# Patient Record
Sex: Female | Born: 1935 | Race: White | Hispanic: No | Marital: Married | Smoking: Never smoker
Health system: Southern US, Community
[De-identification: ages and names within clinical notes are randomized; demographics above are authoritative.]

## PROBLEM LIST (undated history)

## (undated) DIAGNOSIS — K219 Gastro-esophageal reflux disease without esophagitis: Secondary | ICD-10-CM

## (undated) DIAGNOSIS — I10 Essential (primary) hypertension: Secondary | ICD-10-CM

## (undated) DIAGNOSIS — Z9289 Personal history of other medical treatment: Secondary | ICD-10-CM

## (undated) DIAGNOSIS — E785 Hyperlipidemia, unspecified: Secondary | ICD-10-CM

## (undated) DIAGNOSIS — E119 Type 2 diabetes mellitus without complications: Secondary | ICD-10-CM

## (undated) HISTORY — PX: CHOLECYSTECTOMY: SHX55

## (undated) HISTORY — PX: EXCISION MORTON'S NEUROMA: SHX5013

## (undated) HISTORY — PX: TUBAL LIGATION: SHX77

## (undated) HISTORY — PX: ABDOMINAL HYSTERECTOMY: SHX81

---

## 2014-03-30 ENCOUNTER — Encounter (HOSPITAL_COMMUNITY): Payer: Self-pay | Admitting: Emergency Medicine

## 2014-03-30 ENCOUNTER — Emergency Department (HOSPITAL_COMMUNITY): Payer: Medicare HMO

## 2014-03-30 ENCOUNTER — Observation Stay (HOSPITAL_COMMUNITY): Payer: Medicare HMO

## 2014-03-30 ENCOUNTER — Observation Stay (HOSPITAL_COMMUNITY)
Admission: EM | Admit: 2014-03-30 | Discharge: 2014-03-31 | Disposition: A | Discharge status: Home / Self Care | Payer: Medicare HMO | Attending: Interventional Cardiology | Admitting: Interventional Cardiology

## 2014-03-30 DIAGNOSIS — E785 Hyperlipidemia, unspecified: Secondary | ICD-10-CM | POA: Diagnosis present

## 2014-03-30 DIAGNOSIS — R079 Chest pain, unspecified: Secondary | ICD-10-CM | POA: Insufficient documentation

## 2014-03-30 DIAGNOSIS — Z9289 Personal history of other medical treatment: Secondary | ICD-10-CM

## 2014-03-30 DIAGNOSIS — M546 Pain in thoracic spine: Principal | ICD-10-CM

## 2014-03-30 DIAGNOSIS — E1149 Type 2 diabetes mellitus with other diabetic neurological complication: Secondary | ICD-10-CM

## 2014-03-30 DIAGNOSIS — E1142 Type 2 diabetes mellitus with diabetic polyneuropathy: Secondary | ICD-10-CM

## 2014-03-30 DIAGNOSIS — I10 Essential (primary) hypertension: Secondary | ICD-10-CM | POA: Diagnosis present

## 2014-03-30 DIAGNOSIS — R0789 Other chest pain: Secondary | ICD-10-CM

## 2014-03-30 DIAGNOSIS — IMO0002 Reserved for concepts with insufficient information to code with codable children: Secondary | ICD-10-CM | POA: Insufficient documentation

## 2014-03-30 DIAGNOSIS — K219 Gastro-esophageal reflux disease without esophagitis: Secondary | ICD-10-CM

## 2014-03-30 DIAGNOSIS — Z885 Allergy status to narcotic agent status: Secondary | ICD-10-CM | POA: Diagnosis not present

## 2014-03-30 DIAGNOSIS — M549 Dorsalgia, unspecified: Secondary | ICD-10-CM | POA: Diagnosis present

## 2014-03-30 DIAGNOSIS — E118 Type 2 diabetes mellitus with unspecified complications: Secondary | ICD-10-CM

## 2014-03-30 DIAGNOSIS — E119 Type 2 diabetes mellitus without complications: Secondary | ICD-10-CM | POA: Diagnosis present

## 2014-03-30 DIAGNOSIS — E1165 Type 2 diabetes mellitus with hyperglycemia: Secondary | ICD-10-CM | POA: Insufficient documentation

## 2014-03-30 HISTORY — DX: Gastro-esophageal reflux disease without esophagitis: K21.9

## 2014-03-30 HISTORY — DX: Type 2 diabetes mellitus without complications: E11.9

## 2014-03-30 HISTORY — DX: Essential (primary) hypertension: I10

## 2014-03-30 HISTORY — DX: Personal history of other medical treatment: Z92.89

## 2014-03-30 HISTORY — DX: Hyperlipidemia, unspecified: E78.5

## 2014-03-30 LAB — CBC WITH DIFFERENTIAL/PLATELET
Basophils Absolute: 0 10*3/uL (ref 0.0–0.1)
Basophils Relative: 0 % (ref 0–1)
EOS ABS: 0.1 10*3/uL (ref 0.0–0.7)
EOS PCT: 1 % (ref 0–5)
HCT: 41.7 % (ref 36.0–46.0)
HEMOGLOBIN: 14.7 g/dL (ref 12.0–15.0)
Lymphocytes Relative: 32 % (ref 12–46)
Lymphs Abs: 2.9 10*3/uL (ref 0.7–4.0)
MCH: 31.6 pg (ref 26.0–34.0)
MCHC: 35.3 g/dL (ref 30.0–36.0)
MCV: 89.7 fL (ref 78.0–100.0)
MONOS PCT: 7 % (ref 3–12)
Monocytes Absolute: 0.7 10*3/uL (ref 0.1–1.0)
Neutro Abs: 5.5 10*3/uL (ref 1.7–7.7)
Neutrophils Relative %: 60 % (ref 43–77)
Platelets: 219 10*3/uL (ref 150–400)
RBC: 4.65 MIL/uL (ref 3.87–5.11)
RDW: 13 % (ref 11.5–15.5)
WBC: 9.1 10*3/uL (ref 4.0–10.5)

## 2014-03-30 LAB — COMPREHENSIVE METABOLIC PANEL
ALK PHOS: 80 U/L (ref 39–117)
ALT: 24 U/L (ref 0–35)
AST: 22 U/L (ref 0–37)
Albumin: 4.1 g/dL (ref 3.5–5.2)
Anion gap: 17 — ABNORMAL HIGH (ref 5–15)
BUN: 9 mg/dL (ref 6–23)
CALCIUM: 9.5 mg/dL (ref 8.4–10.5)
CO2: 21 mEq/L (ref 19–32)
Chloride: 96 mEq/L (ref 96–112)
Creatinine, Ser: 0.49 mg/dL — ABNORMAL LOW (ref 0.50–1.10)
GLUCOSE: 325 mg/dL — AB (ref 70–99)
Potassium: 3.7 mEq/L (ref 3.7–5.3)
Sodium: 134 mEq/L — ABNORMAL LOW (ref 137–147)
Total Bilirubin: 0.7 mg/dL (ref 0.3–1.2)
Total Protein: 7.9 g/dL (ref 6.0–8.3)

## 2014-03-30 LAB — URINALYSIS, ROUTINE W REFLEX MICROSCOPIC
BILIRUBIN URINE: NEGATIVE
Hgb urine dipstick: NEGATIVE
KETONES UR: 15 mg/dL — AB
Leukocytes, UA: NEGATIVE
Nitrite: NEGATIVE
PH: 5 (ref 5.0–8.0)
Protein, ur: NEGATIVE mg/dL
Specific Gravity, Urine: 1.038 — ABNORMAL HIGH (ref 1.005–1.030)
Urobilinogen, UA: 0.2 mg/dL (ref 0.0–1.0)

## 2014-03-30 LAB — HEMOGLOBIN A1C
Hgb A1c MFr Bld: 11 % — ABNORMAL HIGH (ref <5.7)
Mean Plasma Glucose: 269 mg/dL — ABNORMAL HIGH (ref <117)

## 2014-03-30 LAB — URINE MICROSCOPIC-ADD ON

## 2014-03-30 LAB — LIPASE, BLOOD: Lipase: 33 U/L (ref 11–59)

## 2014-03-30 LAB — TROPONIN I: Troponin I: <0.3 ng/mL (ref <0.30)

## 2014-03-30 LAB — I-STAT TROPONIN, ED: Troponin i, poc: 0 ng/mL (ref 0.00–0.08)

## 2014-03-30 LAB — GLUCOSE, CAPILLARY: Glucose-Capillary: 256 mg/dL — ABNORMAL HIGH (ref 70–99)

## 2014-03-30 LAB — TSH: TSH: 25.01 u[IU]/mL — ABNORMAL HIGH (ref 0.350–4.500)

## 2014-03-30 MED ORDER — NITROGLYCERIN 0.4 MG SL SUBL: 0.4000 mg | SUBLINGUAL_TABLET | SUBLINGUAL | Status: DC | PRN

## 2014-03-30 MED ORDER — ONDANSETRON HCL 4 MG/2ML IJ SOLN
4.0000 mg | Freq: Once | INTRAMUSCULAR | Status: AC
Start: 2014-03-30 — End: 2014-03-30
  Administered 2014-03-30: 4 mg via INTRAVENOUS
  Filled 2014-03-30: qty 2

## 2014-03-30 MED ORDER — ATORVASTATIN CALCIUM 20 MG PO TABS
20.0000 mg | ORAL_TABLET | Freq: Every day | ORAL | Status: DC
  Filled 2014-03-30: qty 1

## 2014-03-30 MED ORDER — IOHEXOL 350 MG/ML SOLN
80.0000 mL | Freq: Once | INTRAVENOUS | Status: AC | PRN
  Administered 2014-03-30: 100 mL via INTRAVENOUS

## 2014-03-30 MED ORDER — METOPROLOL TARTRATE 12.5 MG HALF TABLET
12.5000 mg | ORAL_TABLET | Freq: Two times a day (BID) | ORAL | Status: DC
  Administered 2014-03-30: 12.5 mg via ORAL
  Filled 2014-03-30 (×3): qty 1

## 2014-03-30 MED ORDER — ACETAMINOPHEN 325 MG PO TABS: 650.0000 mg | ORAL_TABLET | ORAL | Status: DC | PRN

## 2014-03-30 MED ORDER — ONDANSETRON HCL 4 MG/2ML IJ SOLN: 4.0000 mg | Freq: Four times a day (QID) | INTRAMUSCULAR | Status: DC | PRN

## 2014-03-30 MED ORDER — GI COCKTAIL ~~LOC~~: 30.0000 mL | Freq: Three times a day (TID) | ORAL | Status: DC | PRN

## 2014-03-30 MED ORDER — PANTOPRAZOLE SODIUM 40 MG PO TBEC
40.0000 mg | DELAYED_RELEASE_TABLET | Freq: Every day | ORAL | Status: DC
  Administered 2014-03-30: 40 mg via ORAL
  Filled 2014-03-30: qty 1

## 2014-03-30 MED ORDER — ASPIRIN 81 MG PO CHEW
324.0000 mg | CHEWABLE_TABLET | Freq: Once | ORAL | Status: AC
  Administered 2014-03-30: 324 mg via ORAL
  Filled 2014-03-30: qty 4

## 2014-03-30 MED ORDER — ASPIRIN EC 81 MG PO TBEC
81.0000 mg | DELAYED_RELEASE_TABLET | Freq: Every day | ORAL | Status: DC
  Administered 2014-03-31: 81 mg via ORAL
  Filled 2014-03-30: qty 1

## 2014-03-30 MED ORDER — INSULIN ASPART 100 UNIT/ML ~~LOC~~ SOLN
0.0000 [IU] | Freq: Three times a day (TID) | SUBCUTANEOUS | Status: DC
  Administered 2014-03-31 (×2): 8 [IU] via SUBCUTANEOUS
  Administered 2014-03-31: 5 [IU] via SUBCUTANEOUS

## 2014-03-30 MED ORDER — MORPHINE SULFATE 2 MG/ML IJ SOLN
2.0000 mg | Freq: Once | INTRAMUSCULAR | Status: AC
  Administered 2014-03-30: 2 mg via INTRAVENOUS
  Filled 2014-03-30: qty 1

## 2014-03-30 MED ORDER — SODIUM CHLORIDE 0.9 % IV BOLUS (SEPSIS)
1000.0000 mL | Freq: Once | INTRAVENOUS | Status: AC
  Administered 2014-03-30: 1000 mL via INTRAVENOUS

## 2014-03-30 NOTE — ED Notes (Signed)
Attempted report X1

## 2014-03-30 NOTE — ED Notes (Signed)
Josh, PA at bedside. °

## 2014-03-30 NOTE — ED Notes (Signed)
Patient transported to X-ray 

## 2014-03-30 NOTE — ED Notes (Signed)
Cardiology at bedside.

## 2014-03-30 NOTE — H&P (Signed)
Cardiologist:  New PCP: Donnajean Lopes, MD  [name] is an 80 y.o. female.   Chief Complaint: Chest pain HPI:   The patient is a 80 yo female with a history of vertigo for which she takes dramamine and was just given a script for meclizine(surgical history below).  Until recently she has not seen a doctor for a very long time.  She reports developing severe, 10/10 back pain yesterday between her shoulder blades and lesser pain in the right and left sides of her chest.   The CP appears relieved with belching but returns.  She gets up about two times per night to urinate and gets numbness and tingling in her feet.  The patient currently denies nausea, vomiting, fever, diaphoresis, orthopnea, PND, cough, congestion, abdominal pain, hematochezia, melena, lower extremity edema, claudication.  She is currently pain free but was given morphine.     History reviewed. No pertinent past medical history.  Past Surgical History  Procedure Laterality Date  . Tubal ligation    . Cholecystectomy    . Abdominal hysterectomy    . Excision morton's neuroma      Family History  Problem Relation Age of Onset  . Rheumatic fever Mother 50   Social History:  reports that she has never smoked. She does not have any smokeless tobacco history on file. She reports that she drinks alcohol. Her drug history is not on file.  Allergies:  Allergies  Allergen Reactions  . Codeine Nausea And Vomiting and Other (See Comments)    "body shaking"     (Not in a hospital admission)  Results for orders placed during the hospital encounter of 03/30/14 (from the past 48 hour(s))  COMPREHENSIVE METABOLIC PANEL     Status: Abnormal   Collection Time    03/30/14  3:23 PM      Result Value Ref Range   Sodium 134 (*) 137 - 147 mEq/L   Potassium 3.7  3.7 - 5.3 mEq/L   Chloride 96  96 - 112 mEq/L   CO2 21  19 - 32 mEq/L   Glucose, Bld 325 (*) 70 - 99 mg/dL   BUN 9  6 - 23 mg/dL   Creatinine, Ser 0.49 (*) 0.50  - 1.10 mg/dL   Calcium 9.5  8.4 - 10.5 mg/dL   Total Protein 7.9  6.0 - 8.3 g/dL   Albumin 4.1  3.5 - 5.2 g/dL   AST 22  0 - 37 U/L   ALT 24  0 - 35 U/L   Alkaline Phosphatase 80  39 - 117 U/L   Total Bilirubin 0.7  0.3 - 1.2 mg/dL   GFR calc non Af Amer >90  >90 mL/min   GFR calc Af Amer >90  >90 mL/min   Comment: (NOTE)     The eGFR has been calculated using the CKD EPI equation.     This calculation has not been validated in all clinical situations.     eGFR's persistently <90 mL/min signify possible Chronic Kidney     Disease.   Anion gap 17 (*) 5 - 15  CBC WITH DIFFERENTIAL     Status: None   Collection Time    03/30/14  3:23 PM      Result Value Ref Range   WBC 9.1  4.0 - 10.5 K/uL   RBC 4.65  3.87 - 5.11 MIL/uL   Hemoglobin 14.7  12.0 - 15.0 g/dL   HCT 41.7  36.0 - 46.0 %  MCV 89.7  78.0 - 100.0 fL   MCH 31.6  26.0 - 34.0 pg   MCHC 35.3  30.0 - 36.0 g/dL   RDW 13.0  11.5 - 15.5 %   Platelets 219  150 - 400 K/uL   Neutrophils Relative % 60  43 - 77 %   Neutro Abs 5.5  1.7 - 7.7 K/uL   Lymphocytes Relative 32  12 - 46 %   Lymphs Abs 2.9  0.7 - 4.0 K/uL   Monocytes Relative 7  3 - 12 %   Monocytes Absolute 0.7  0.1 - 1.0 K/uL   Eosinophils Relative 1  0 - 5 %   Eosinophils Absolute 0.1  0.0 - 0.7 K/uL   Basophils Relative 0  0 - 1 %   Basophils Absolute 0.0  0.0 - 0.1 K/uL  LIPASE, BLOOD     Status: None   Collection Time    03/30/14  3:23 PM      Result Value Ref Range   Lipase 33  11 - 59 U/L  URINALYSIS, ROUTINE W REFLEX MICROSCOPIC     Status: Abnormal   Collection Time    03/30/14  3:28 PM      Result Value Ref Range   Color, Urine YELLOW  YELLOW   APPearance CLOUDY (*) CLEAR   Specific Gravity, Urine 1.038 (*) 1.005 - 1.030   pH 5.0  5.0 - 8.0   Glucose, UA >1000 (*) NEGATIVE mg/dL   Hgb urine dipstick NEGATIVE  NEGATIVE   Bilirubin Urine NEGATIVE  NEGATIVE   Ketones, ur 15 (*) NEGATIVE mg/dL   Protein, ur NEGATIVE  NEGATIVE mg/dL   Urobilinogen, UA  0.2  0.0 - 1.0 mg/dL   Nitrite NEGATIVE  NEGATIVE   Leukocytes, UA NEGATIVE  NEGATIVE  URINE MICROSCOPIC-ADD ON     Status: Abnormal   Collection Time    03/30/14  3:28 PM      Result Value Ref Range   Squamous Epithelial / LPF MANY (*) RARE   WBC, UA 11-20  <3 WBC/hpf   Bacteria, UA MANY (*) RARE  I-STAT TROPOININ, ED     Status: None   Collection Time    03/30/14  3:30 PM      Result Value Ref Range   Troponin i, poc 0.00  0.00 - 0.08 ng/mL   Comment 3            Comment: Due to the release kinetics of cTnI,     a negative result within the first hours     of the onset of symptoms does not rule out     myocardial infarction with certainty.     If myocardial infarction is still suspected,     repeat the test at appropriate intervals.   Dg Chest 2 View  03/30/2014   CLINICAL DATA:  LEFT side chest and back pain which began at 12:30 a.m.  EXAM: CHEST  2 VIEW  COMPARISON:  None  FINDINGS: Upper normal heart size.  Calcified tortuous aorta.  Mediastinal contours and pulmonary vascularity normal.  Bronchitic and emphysematous changes consistent with COPD.  No acute infiltrate, pleural effusion or pneumothorax.  Bones diffusely demineralized.  Surgical clips RIGHT upper quadrant question cholecystectomy.  IMPRESSION: COPD changes.  No acute abnormalities.   Electronically Signed   By: Lavonia Dana M.D.   On: 03/30/2014 16:15    Review of Systems  Constitutional: Negative for fever and diaphoresis.  HENT: Negative for congestion and  sore throat.   Respiratory: Negative for cough and shortness of breath.   Cardiovascular: Positive for chest pain. Negative for orthopnea, leg swelling and PND.  Gastrointestinal: Negative for nausea, vomiting, abdominal pain, blood in stool and melena.  Genitourinary: Negative for frequency and hematuria.  Musculoskeletal: Positive for back pain (Between shoulder blades).  Neurological: Negative for dizziness and weakness.  All other systems reviewed and are  negative.   Blood pressure 116/94, pulse 96, temperature 98.5 F (36.9 C), temperature source Oral, resp. rate 18, weight 153 lb (69.4 kg), SpO2 99.00%. Physical Exam  Nursing note and vitals reviewed. Constitutional: She is oriented to person, place, and time. She appears well-developed and well-nourished.  HENT:  Head: Normocephalic and atraumatic.  Mouth/Throat: Oropharynx is clear and moist.  Eyes: EOM are normal. Pupils are equal, round, and reactive to light. No scleral icterus.  Neck: Normal range of motion. Neck supple.  Cardiovascular: Normal rate, regular rhythm, S1 normal and S2 normal.   No murmur heard. Pulses:      Radial pulses are 2+ on the right side, and 2+ on the left side.       Dorsalis pedis pulses are 2+ on the right side, and 2+ on the left side.  No carotid bruit.  Respiratory: Effort normal and breath sounds normal. No respiratory distress. She has no wheezes. She has no rales.  GI: Soft. Bowel sounds are normal. She exhibits no distension. There is no tenderness.  Musculoskeletal: She exhibits no edema.  Lymphadenopathy:    She has no cervical adenopathy.  Neurological: She is alert and oriented to person, place, and time. She exhibits normal muscle tone.  Skin: Skin is warm and dry.  Psychiatric: She has a normal mood and affect.     Assessment/Plan Principal Problem:   Back pain Active Problems:   Diabetes mellitus, type 2   Plan:  Admit to telemetry.  Rule out with serial troponin.  Check A1C, TSH, lipids.  Will start a statin.  Will use SS insulin and check CBGs.  Will decide on home DM meds after A1C results.  If > 10 will likely need basil insulin.   Lexiscan myovue and echo tomorrow.   Tarri Fuller, PA-C 03/30/2014, 6:08 PM

## 2014-03-30 NOTE — ED Provider Notes (Signed)
CSN: [id number]     Arrival date & time 03/30/14  1440 History   First MD Initiated Contact with Patient 03/30/14 1527     Chief Complaint  Patient presents with  . Chest Pain  . Back Pain     (Consider location/radiation/quality/duration/timing/severity/associated sxs/prior Treatment) HPI Comments: Patient with no significant past medical history -- presents with complaint of chest pain and back pain which began last night while at rest. Patient has not had this pain in the past. Patient describes 3/10 pain in her right and left chest. She also has pain in the middle of her upper back, just left of the midline. This pain is more severe. She has not had shortness of breath, palpitations. Patient has some baseline vertigo and associated nausea which is unchanged. She has not had any lower extremity swelling. No recent travel or history of blood clots. Patient is currently on no medication other than occasional Dramamine and Tylenol for pain. Patient took Tylenol last night without relief. Patient was seen by her PCP today and sent to the emergency department for further evaluation. Patient does not have a history of hypertension, diabetes, smoking. No family history of MI or other heart problems. Patient does not know if she has had high cholesterol in the past. Onset of symptoms acute. Course is constant. Nothing makes symptoms better or worse.  Patient is a 80 y.o. female presenting with chest pain and back pain. The history is provided by the patient and a relative.  Chest Pain Associated symptoms: back pain   Associated symptoms: no abdominal pain, no cough, no diaphoresis, no fever, no nausea, no palpitations, no shortness of breath and not vomiting   Back Pain Associated symptoms: chest pain   Associated symptoms: no abdominal pain, no dysuria and no fever     History reviewed. No pertinent past medical history. History reviewed. No pertinent past surgical history. No family history on  file. History  Substance Use Topics  . Smoking status: Never Smoker   . Smokeless tobacco: Not on file  . Alcohol Use: Yes   OB History   Grav Para Term Preterm Abortions TAB SAB Ect Mult Living                 Review of Systems  Constitutional: Negative for fever and diaphoresis.  Eyes: Negative for redness.  Respiratory: Negative for cough and shortness of breath.   Cardiovascular: Positive for chest pain. Negative for palpitations and leg swelling.  Gastrointestinal: Negative for nausea, vomiting and abdominal pain.  Genitourinary: Negative for dysuria.  Musculoskeletal: Positive for back pain. Negative for neck pain.  Skin: Negative for rash.  Neurological: Negative for syncope and light-headedness.    Allergies  Codeine  Home Medications   Prior to Admission medications   Not on File   BP 135/96  Pulse 96  Temp(Src) 98.5 F (36.9 C) (Oral)  Resp 16  Wt 153 lb (69.4 kg)  SpO2 99%  Physical Exam  Nursing note and vitals reviewed. Constitutional: She appears well-developed and well-nourished.  HENT:  Head: Normocephalic and atraumatic.  Mouth/Throat: Mucous membranes are normal. Mucous membranes are not dry.  Eyes: Conjunctivae are normal.  Neck: Trachea normal and normal range of motion. Neck supple. Normal carotid pulses and no JVD present. No muscular tenderness present. Carotid bruit is not present. No tracheal deviation present.  Cardiovascular: Normal rate, regular rhythm, S1 normal, S2 normal, normal heart sounds and intact distal pulses.  Exam reveals no decreased pulses.  No murmur heard. Pulmonary/Chest: Effort normal. No respiratory distress. She has no wheezes. She exhibits tenderness (palpation over R/L chest wall reproduces pain).  Abdominal: Soft. Normal aorta and bowel sounds are normal. There is no tenderness. There is no rebound and no guarding.  Musculoskeletal: Normal range of motion. She exhibits no edema and no tenderness.  No tenderness  to palpation over cervical or thoracic spine. Minimal tenderness, T-spine, just left of mid-line.  Neurological: She is alert.  Skin: Skin is warm and dry. She is not diaphoretic. No cyanosis. No pallor.  Psychiatric: She has a normal mood and affect.    ED Course  Procedures (including critical care time) Labs Review Labs Reviewed  COMPREHENSIVE METABOLIC PANEL - Abnormal; Notable for the following:    Sodium 134 (*)    Glucose, Bld 325 (*)    Creatinine, Ser 0.49 (*)    Anion gap 17 (*)    All other components within normal limits  URINALYSIS, ROUTINE W REFLEX MICROSCOPIC - Abnormal; Notable for the following:    APPearance CLOUDY (*)    Specific Gravity, Urine 1.038 (*)    Glucose, UA >1000 (*)    Ketones, ur 15 (*)    All other components within normal limits  URINE MICROSCOPIC-ADD ON - Abnormal; Notable for the following:    Squamous Epithelial / LPF MANY (*)    Bacteria, UA MANY (*)    All other components within normal limits  URINE CULTURE  CBC WITH DIFFERENTIAL  LIPASE, BLOOD  HEMOGLOBIN A1C  I-STAT TROPOININ, ED    Imaging Review Dg Chest 2 View  03/30/2014   CLINICAL DATA:  LEFT side chest and back pain which began at 12:30 a.m.  EXAM: CHEST  2 VIEW  COMPARISON:  None  FINDINGS: Upper normal heart size.  Calcified tortuous aorta.  Mediastinal contours and pulmonary vascularity normal.  Bronchitic and emphysematous changes consistent with COPD.  No acute infiltrate, pleural effusion or pneumothorax.  Bones diffusely demineralized.  Surgical clips RIGHT upper quadrant question cholecystectomy.  IMPRESSION: COPD changes.  No acute abnormalities.   Electronically Signed   By: Ulyses Southward M.D.   On: 03/30/2014 16:15     EKG Interpretation   Date/Time:  Monday March 30 2014 14:49:13 EDT Ventricular Rate:  93 PR Interval:  170 QRS Duration: 74 QT Interval:  372 QTC Calculation: 462 R Axis:   -46 Text Interpretation:  Normal sinus rhythm Left axis deviation Abnormal  ECG  Confirmed by Rhunette Croft, MD, Janey Genta [phone]) on 03/30/2014 3:48:59 PM      3:45 PM Patient seen and examined. ASA ordered. EKG reviewed (from ED and PCP office that patient brought with her). Work-up initiated. Medications ordered.   Vital signs reviewed and are as follows: Filed Vitals:   03/30/14 1448  BP: 135/96  Pulse: 96  Temp: 98.5 F (36.9 C)  Resp: 16   6:26 PM Patient was discussed with Dr. Rhunette Croft prior. Cardiology called and they agree to consult.   New DM noted.   Cardiology has seen and will admit.   MDM   Final diagnoses:  Midline thoracic back pain  Type 2 diabetes mellitus with diabetic polyneuropathy   Admit for CP, new DM diagnosis, HEART score of 4.     Renne Crigler, PA-C 03/30/14 1827

## 2014-03-30 NOTE — ED Notes (Addendum)
Cp and back pain that started yesterday no sob  No n/v back pain is worse that chest pain no problems when she voids burping a lot she states

## 2014-03-30 NOTE — ED Notes (Signed)
Lab called to add on blood work 

## 2014-03-30 NOTE — H&P (Addendum)
Seen in tandem with Mr. Leron CroakHager. All aspects of care were discussed and evaluated.  The patient developed back pain, that moved into the chest starting at midnight. No dyspnea or other symptoms. Duration has been greater than 12 hours and is now somewhat better. No history of hypertension. She has undiagnosed DM,2 with probable neuropathy.  No abnormalities on exam. The pulses are equal. Chest is clear and no murmur or pericardial rub is noted.  ECG is normal and the CXR shows aortic calcification. Initial markers are unremarkable.  Etiology of pain is unclear but CAD and aortic diease need to be excluded. Will cycle markers, check echocardiogram, and ultimately a stress perfusion study will be done if markers and ECG are normal. Finally aortic disease should not be forgotton, and CT chest with angio may be needed if definite etiology not found.

## 2014-03-31 ENCOUNTER — Observation Stay (HOSPITAL_COMMUNITY): Payer: Medicare HMO

## 2014-03-31 ENCOUNTER — Encounter (HOSPITAL_COMMUNITY): Payer: Self-pay | Admitting: Physician Assistant

## 2014-03-31 DIAGNOSIS — Z9289 Personal history of other medical treatment: Secondary | ICD-10-CM

## 2014-03-31 DIAGNOSIS — I517 Cardiomegaly: Secondary | ICD-10-CM

## 2014-03-31 DIAGNOSIS — E785 Hyperlipidemia, unspecified: Secondary | ICD-10-CM | POA: Diagnosis present

## 2014-03-31 DIAGNOSIS — R079 Chest pain, unspecified: Secondary | ICD-10-CM

## 2014-03-31 DIAGNOSIS — K219 Gastro-esophageal reflux disease without esophagitis: Secondary | ICD-10-CM

## 2014-03-31 DIAGNOSIS — I10 Essential (primary) hypertension: Secondary | ICD-10-CM | POA: Diagnosis present

## 2014-03-31 DIAGNOSIS — R0789 Other chest pain: Secondary | ICD-10-CM

## 2014-03-31 DIAGNOSIS — E119 Type 2 diabetes mellitus without complications: Secondary | ICD-10-CM | POA: Diagnosis present

## 2014-03-31 LAB — BASIC METABOLIC PANEL
Anion gap: 14 (ref 5–15)
BUN: 10 mg/dL (ref 6–23)
CHLORIDE: 99 meq/L (ref 96–112)
CO2: 24 mEq/L (ref 19–32)
Calcium: 8.8 mg/dL (ref 8.4–10.5)
Creatinine, Ser: 0.58 mg/dL (ref 0.50–1.10)
GFR, EST NON AFRICAN AMERICAN: 86 mL/min — AB (ref >90)
Glucose, Bld: 260 mg/dL — ABNORMAL HIGH (ref 70–99)
POTASSIUM: 3.5 meq/L — AB (ref 3.7–5.3)
Sodium: 137 mEq/L (ref 137–147)

## 2014-03-31 LAB — CBC
HCT: 38.8 % (ref 36.0–46.0)
Hemoglobin: 13.3 g/dL (ref 12.0–15.0)
MCH: 31.7 pg (ref 26.0–34.0)
MCHC: 34.3 g/dL (ref 30.0–36.0)
MCV: 92.6 fL (ref 78.0–100.0)
PLATELETS: 198 10*3/uL (ref 150–400)
RBC: 4.19 MIL/uL (ref 3.87–5.11)
RDW: 13.3 % (ref 11.5–15.5)
WBC: 9.4 10*3/uL (ref 4.0–10.5)

## 2014-03-31 LAB — GLUCOSE, CAPILLARY
GLUCOSE-CAPILLARY: 286 mg/dL — AB (ref 70–99)
Glucose-Capillary: 266 mg/dL — ABNORMAL HIGH (ref 70–99)
Glucose-Capillary: 207 mg/dL — ABNORMAL HIGH (ref 70–99)

## 2014-03-31 LAB — T4, FREE: FREE T4: 0.61 ng/dL — AB (ref 0.80–1.80)

## 2014-03-31 LAB — T3, FREE: T3, Free: 2.3 pg/mL (ref 2.3–4.2)

## 2014-03-31 LAB — LIPID PANEL
CHOL/HDL RATIO: 9.5 ratio
CHOLESTEROL: 218 mg/dL — AB (ref 0–200)
HDL: 23 mg/dL — ABNORMAL LOW (ref >39)
LDL Cholesterol: UNDETERMINED mg/dL (ref 0–99)
TRIGLYCERIDES: 778 mg/dL — AB (ref <150)
VLDL: UNDETERMINED mg/dL (ref 0–40)

## 2014-03-31 LAB — TROPONIN I

## 2014-03-31 MED ORDER — METOPROLOL TARTRATE 12.5 MG HALF TABLET: 12.5000 mg | ORAL_TABLET | Freq: Two times a day (BID) | ORAL | Status: DC

## 2014-03-31 MED ORDER — REGADENOSON 0.4 MG/5ML IV SOLN
INTRAVENOUS | Status: AC
  Administered 2014-03-31: 0.4 mg via INTRAVENOUS
  Filled 2014-03-31: qty 5

## 2014-03-31 MED ORDER — METFORMIN HCL 500 MG PO TABS: 500.0000 mg | ORAL_TABLET | Freq: Two times a day (BID) | ORAL | Status: AC

## 2014-03-31 MED ORDER — LIVING WELL WITH DIABETES BOOK
Freq: Once | Status: AC
  Administered 2014-03-31: 12:00:00
  Filled 2014-03-31: qty 1

## 2014-03-31 MED ORDER — TECHNETIUM TC 99M SESTAMIBI GENERIC - CARDIOLITE
30.0000 | Freq: Once | INTRAVENOUS | Status: AC | PRN
  Administered 2014-03-31: 30 via INTRAVENOUS

## 2014-03-31 MED ORDER — NITROGLYCERIN 0.4 MG SL SUBL: 0.4000 mg | SUBLINGUAL_TABLET | SUBLINGUAL | Status: AC | PRN

## 2014-03-31 MED ORDER — ATORVASTATIN CALCIUM 20 MG PO TABS: 20.0000 mg | ORAL_TABLET | Freq: Every day | ORAL | Status: AC

## 2014-03-31 MED ORDER — REGADENOSON 0.4 MG/5ML IV SOLN
0.4000 mg | Freq: Once | INTRAVENOUS | Status: AC
  Administered 2014-03-31: 0.4 mg via INTRAVENOUS
  Filled 2014-03-31: qty 5

## 2014-03-31 MED ORDER — TECHNETIUM TC 99M SESTAMIBI GENERIC - CARDIOLITE
10.0000 | Freq: Once | INTRAVENOUS | Status: AC | PRN
  Administered 2014-03-31: 10 via INTRAVENOUS

## 2014-03-31 MED ORDER — PANTOPRAZOLE SODIUM 40 MG PO TBEC: 40.0000 mg | DELAYED_RELEASE_TABLET | Freq: Every day | ORAL | Status: AC

## 2014-03-31 MED ORDER — ASPIRIN 81 MG PO TBEC: 81.0000 mg | DELAYED_RELEASE_TABLET | Freq: Every day | ORAL | Status: AC

## 2014-03-31 NOTE — Progress Notes (Addendum)
Inpatient Diabetes Program Recommendations  AACE/ADA: New Consensus Statement on Inpatient Glycemic Control (2013)  Target Ranges:  Prepandial:   less than 140 mg/dL      Peak postprandial:   less than 180 mg/dL (1-2 hours)      Critically ill patients:  140 - 180 mg/dL     Results for [name], [name] (MRN [id number]) as of 03/31/2014 13:47  Ref. Range 03/31/2014 07:20 03/31/2014 11:40  Glucose-Capillary Latest Range: 70-99 mg/dL 409286 (H) 811266 (H)    Results for [name], [name] (MRN [id number]) as of 03/31/2014 13:47  Ref. Range 03/30/2014 15:23  Hemoglobin A1C Latest Range: <5.7 % 11.0 (H)     Admitted with CP.  Underwent Myoview and Echo today.    Diagnosed with DM this admission.  A1c 11%. [facility]   **Spoke with pt about new diagnosis.  Discussed A1C results with her and explained what an A1C is, basic pathophysiology of DM Type 2, basic home care, importance of checking CBGs and maintaining good CBG control to prevent long-term and short-term complications.  Reviewed signs and symptoms of hyperglycemia and hypoglycemia.  RNs to provide ongoing basic DM education at bedside with this patient.  Have ordered educational booklet, RD consult, and DM videos.  Also discussed with patient basic DM nutrition plan for home.  **Unsure if patient will need insulin for home.  May want to initiate oral diabetes therapy for home and have patient follow-up with Dr. Eloise HarmanPaterson for further diabetes management.  Patient told me she has been eating lots of food at home as of late to try to gain weight.  Has been drinking large milkshakes, regular coke, and large servings of food at home.  If patient makes dietary changes along with oral medications, patient may be able to profoundly reduce her blood sugars.  Encouraged patient to stop drinking regular sodas and large milkshakes.  Also encouraged patient to be careful of her portion sizes of carbohydrate  containing foods.  Note that RD has already seen patient.  MD- Please consider initiation of Metformin 500 mg bid for discharge if pt's kidney function WNL.  If she needs insulin, will need instruction for insulin before d/c.  If decision made to not start insulin, could try a DPP-4 inhibitor like Januvia 50 mg once daily in addition to the Metformin to start.  MD- Patient will also need a CBG meter.  Please place order for CBG at time of discharge (order #[id number])   Will follow Ambrose FinlandJeannine Johnston Bethel Gaglio RN, MSN, CDE Diabetes Coordinator Inpatient Diabetes Program Team Pager: [phone] (8a-10p)

## 2014-03-31 NOTE — Progress Notes (Signed)
Pt given lexiscan with Deborha PaymentKatie Stern Pa present

## 2014-03-31 NOTE — Discharge Instructions (Signed)
PLEASE START YOUR METFORMIN on 04/02/14 (at least 48 hours after contrast dye exposure from Cat scan). Also please follow up with your primary care provider about your thyroid and your diabetes management.    Chest Pain (Nonspecific) It is often hard to give a specific diagnosis for the cause of chest pain. There is always a chance that your pain could be related to something serious, such as a heart attack or a blood clot in the lungs. You need to follow up with your health care provider for further evaluation. CAUSES   Heartburn.  Pneumonia or bronchitis.  Anxiety or stress.  Inflammation around your heart (pericarditis) or lung (pleuritis or pleurisy).  A blood clot in the lung.  A collapsed lung (pneumothorax). It can develop suddenly on its own (spontaneous pneumothorax) or from trauma to the chest.  Shingles infection (herpes zoster virus). The chest wall is composed of bones, muscles, and cartilage. Any of these can be the source of the pain.  The bones can be bruised by injury.  The muscles or cartilage can be strained by coughing or overwork.  The cartilage can be affected by inflammation and become sore (costochondritis). DIAGNOSIS  Lab tests or other studies may be needed to find the cause of your pain. Your health care provider may have you take a test called an ambulatory electrocardiogram (ECG). An ECG records your heartbeat patterns over a 24-hour period. You may also have other tests, such as:  Transthoracic echocardiogram (TTE). During echocardiography, sound waves are used to evaluate how blood flows through your heart.  Transesophageal echocardiogram (TEE).  Cardiac monitoring. This allows your health care provider to monitor your heart rate and rhythm in real time.  Holter monitor. This is a portable device that records your heartbeat and can help diagnose heart arrhythmias. It allows your health care provider to track your heart activity for several days, if  needed.  Stress tests by exercise or by giving medicine that makes the heart beat faster. TREATMENT   Treatment depends on what may be causing your chest pain. Treatment may include:  Acid blockers for heartburn.  Anti-inflammatory medicine.  Pain medicine for inflammatory conditions.  Antibiotics if an infection is present.  You may be advised to change lifestyle habits. This includes stopping smoking and avoiding alcohol, caffeine, and chocolate.  You may be advised to keep your head raised (elevated) when sleeping. This reduces the chance of acid going backward from your stomach into your esophagus. Most of the time, nonspecific chest pain will improve within 2-3 days with rest and mild pain medicine.  HOME CARE INSTRUCTIONS   If antibiotics were prescribed, take them as directed. Finish them even if you start to feel better.  For the next few days, avoid physical activities that bring on chest pain. Continue physical activities as directed.  Do not use any tobacco products, including cigarettes, chewing tobacco, or electronic cigarettes.  Avoid drinking alcohol.  Only take medicine as directed by your health care provider.  Follow your health care provider's suggestions for further testing if your chest pain does not go away.  Keep any follow-up appointments you made. If you do not go to an appointment, you could develop lasting (chronic) problems with pain. If there is any problem keeping an appointment, call to reschedule. SEEK MEDICAL CARE IF:   Your chest pain does not go away, even after treatment.  You have a rash with blisters on your chest.  You have a fever. SEEK IMMEDIATE MEDICAL  CARE IF:   You have increased chest pain or pain that spreads to your arm, neck, jaw, back, or abdomen.  You have shortness of breath.  You have an increasing cough, or you cough up blood.  You have severe back or abdominal pain.  You feel nauseous or vomit.  You have severe  weakness.  You faint.  You have chills. This is an emergency. Do not wait to see if the pain will go away. Get medical help at once. Call your local emergency services (911 in [place] Do not drive yourself to the hospital. MAKE SURE YOU:   Understand these instructions.  Will watch your condition.  Will get help right away if you are not doing well or get worse. Document Released: 06/14/2005 Document Revised: 09/09/2013 Document Reviewed: 04/09/2008 [facility] Patient Information 2015 [place], [place]. This information is not intended to replace advice given to you by your health care provider. Make sure you discuss any questions you have with your health care provider.

## 2014-03-31 NOTE — Plan of Care (Signed)
Problem: Food- and Nutrition-Related Knowledge Deficit (NB-1.1) Goal: Nutrition education Formal process to instruct or train a patient/client in a skill or to impart knowledge to help patients/clients voluntarily manage or modify food choices and eating behavior to maintain or improve health. Outcome: Completed/Met Date Met:  03/31/14  RD consulted for nutrition education regarding diabetes.    Lab Results  Component Value Date    HGBA1C 11.0* 03/30/2014    RD provided "Carbohydrate Counting for People with Diabetes" handout from the Academy of [employer] and Dietetics. Discussed different food groups and their effects on blood sugar, emphasizing carbohydrate-containing foods. Provided list of carbohydrates and recommended serving sizes of common foods.  Discussed importance of controlled and consistent carbohydrate intake throughout the day. Provided examples of ways to balance meals/snacks and encouraged intake of high-fiber, whole grain complex carbohydrates. Teach back method used.  Expect good compliance.  Body mass index is 27.52 kg/(m^2). Pt meets criteria for overweight based on current BMI.  Current diet order is CHO-modified, patient is consuming approximately 75% of meals at this time. Labs and medications reviewed. No further nutrition interventions warranted at this time. RD contact information provided. If additional nutrition issues arise, please re-consult RD.  Molli Barrows, RD, LDN, [place] Pager [phone] After Hours Pager [phone]

## 2014-03-31 NOTE — Progress Notes (Signed)
  Echocardiogram 2D Echocardiogram has been performed.  Arvil ChacoFoster, Deontra Pereyra 03/31/2014, 3:12 PM

## 2014-03-31 NOTE — Progress Notes (Signed)
Patient Name: [name] Date of Encounter: 03/31/2014     Principal Problem:   Back pain Active Problems:   Diabetes mellitus type 2, uncontrolled, with complications    SUBJECTIVE  No CP currently. Seen in nuclear med for lexiscan myoview. She tolerated this well.   CURRENT MEDS . aspirin EC  81 mg Oral Daily  . atorvastatin  20 mg Oral q1800  . insulin aspart  0-15 Units Subcutaneous TID WC  . living well with diabetes book   Does not apply Once  . metoprolol tartrate  12.5 mg Oral BID  . pantoprazole  40 mg Oral Q0600  . regadenoson      . regadenoson  0.4 mg Intravenous Once    OBJECTIVE  Filed Vitals:   03/30/14 1815 03/30/14 1845 03/30/14 2000 03/31/14 0520  BP: 129/67 126/70 156/89 140/63  Pulse: 67 66 79 69  Temp:   98.4 F (36.9 C) 98 F (36.7 C)  TempSrc:   Oral Oral  Resp: 17 16 16 18   Height:   5\' 3"  (1.6 m)   Weight:   154 lb 6.4 oz (70.035 kg) 155 lb 4.8 oz (70.444 kg)  SpO2: 96% 97% 96% 98%   No intake or output data in the 24 hours ending 03/31/14 0916 Filed Weights   03/30/14 1448 03/30/14 2000 03/31/14 0520  Weight: 153 lb (69.4 kg) 154 lb 6.4 oz (70.035 kg) 155 lb 4.8 oz (70.444 kg)    PHYSICAL EXAM  General: Pleasant, NAD. Neuro: Alert and oriented X 3. Moves all extremities spontaneously. Psych: Normal affect. HEENT:  Normal  Neck: Supple without bruits or JVD. Lungs:  Resp regular and unlabored, CTA. Heart: RRR no s3, s4, or murmurs. Abdomen: Soft, non-tender, non-distended, BS + x 4.  Extremities: No clubbing, cyanosis or edema. DP/PT/Radials 2+ and equal bilaterally.  Accessory Clinical Findings  CBC  Recent Labs  03/30/14 1523 03/31/14 0126  WBC 9.1 9.4  NEUTROABS 5.5  --   HGB 14.7 13.3  HCT 41.7 38.8  MCV 89.7 92.6  PLT 219 198   Basic Metabolic Panel  Recent Labs  03/30/14 1523 03/31/14 0126  NA 134* 137  K 3.7 3.5*  CL 96 99  CO2 21 24  GLUCOSE 325* 260*  BUN 9 10  CREATININE 0.49* 0.58  CALCIUM  9.5 8.8   Liver Function Tests  Recent Labs  03/30/14 1523  AST 22  ALT 24  ALKPHOS 80  BILITOT 0.7  PROT 7.9  ALBUMIN 4.1    Recent Labs  03/30/14 1523  LIPASE 33   Cardiac Enzymes  Recent Labs  03/30/14 2105 03/31/14 0126  TROPONINI <0.30 <0.30    Hemoglobin A1C  Recent Labs  03/30/14 1523  HGBA1C 11.0*   Fasting Lipid Panel  Recent Labs  03/31/14 0126  CHOL 218*  HDL 23*  LDLCALC UNABLE TO CALCULATE IF TRIGLYCERIDE OVER 400 mg/dL  TRIG 161*  CHOLHDL 9.5   Thyroid Function Tests  Recent Labs  03/30/14 2105  TSH 25.010*    TELE  NSR  Radiology/Studies  Dg Chest 2 View  03/30/2014   CLINICAL DATA:  LEFT side chest and back pain which began at 12:30 a.m.  EXAM: CHEST  2 VIEW  COMPARISON:  None  FINDINGS: Upper normal heart size.  Calcified tortuous aorta.  Mediastinal contours and pulmonary vascularity normal.  Bronchitic and emphysematous changes consistent with COPD.  No acute infiltrate, pleural effusion or pneumothorax.  Bones diffusely demineralized.  Surgical clips  RIGHT upper quadrant question cholecystectomy.  IMPRESSION: COPD changes.  No acute abnormalities.   Electronically Signed   By: Ulyses SouthwardMark  Boles M.D.   On: 03/30/2014 16:15   Ct Angio Chest Aortic Dissect W &/or W/o  03/30/2014   CLINICAL DATA:  Chest pain  EXAM: CT ANGIOGRAPHY CHEST WITH CONTRAST  TECHNIQUE: Initially, axial CT images were obtained through the chest without intravenous contrast material. Multidetector CT imaging of the chest was performed using the standard protocol during bolus administration of intravenous contrast. Multiplanar CT image reconstructions and MIPs were obtained to evaluate the vascular anatomy.  CONTRAST:  100mL OMNIPAQUE IOHEXOL 350 MG/ML SOLN  COMPARISON:  Chest radiograph March 30, 2014  FINDINGS: There is atherosclerotic change in the aortic arch region. No aneurysm or dissection is seen. The right common carotid artery and right subclavian artery arise  from a common trunk. There is no appreciable narrowing of the proximal great vessels.  There is no demonstrable pulmonary embolus.  There is underlying centrilobular emphysematous change. There is patchy lower lobe atelectatic change bilaterally. There is no edema or consolidation. There is no appreciable thoracic adenopathy. Pericardium is not thickened. There is a small hiatal hernia.  In the visualized upper abdomen, there is fatty change in the liver. There is incomplete visualization of a cyst arising from the medial upper pole of the left kidney measuring 3.0 x 2.5 cm. There are no blastic or lytic bone lesions. Thyroid appears somewhat inhomogeneous.  Review of the MIP images confirms the above findings.  IMPRESSION: No aneurysm or dissection in the thoracic aorta. No demonstrable pulmonary embolus. Underlying emphysematous change. No edema or consolidation. Small hiatal hernia. Fatty liver.  There is inhomogeneity in the thyroid. Nonemergent thyroid ultrasound advised to further assess.   Electronically Signed   By: Bretta BangWilliam  Woodruff M.D.   On: 03/30/2014 19:30    ASSESSMENT AND PLAN [name] is a 80 y.o. female with no past medical hx by virtue of no medical care who presented to her husbands PCP yesterday complaining of back pain and chest pain. She was referred to the ED for further evaluation.   Chest pain- troponin neg x2. ECG with no acute ST or TW changes.  -- CTA with no aneurysm, dissection or PE. -- Underwent lexiscan myoview today. Awaiting images.  -- ECHO later today -- Continue ASA, BB and statin -- Continue protonix  Newly diagnosed DM- HgA1c 11. She was placed on SSI.   Elevated TSH- markedly elevated at 25. Will add free T3/T4 -- CTA with "inhomogeneity in the thyroid. Nonemergent thyroid ultrasound advised to further assess."   HLD- uncontrolled. TC 218, TG 778; HDL 23. Added a statin. TG will hopefully improve with initiation of DM medications.   HTN- no hx of HTN.  BPs slightly elevated today. Continue BB. Continue to monitor  Signed, Thereasa ParkinSTERN, KATHRYN PA-C  Pager [phone]  The patient had a lexi scan Myoview earlier today, results pending.  She was found to have a significantly elevated TSH.  A T4 and T3 are pending.  Her PCP will now be Dr. Dossie Arbouran Paterson who is her husbands physician also.  Her dyslipidemia may be due in part to her hypothyroidism.  She will be able to followup regarding her hypothyroid condition with him.  Echocardiogram is pending also. She has had no further chest discomfort since admission.  Her only complaint now is a headache from her stress test.

## 2014-03-31 NOTE — Discharge Summary (Signed)
Discharge Summary   Patient ID: [name] MRN: [id number], DOB/AGE: April 10, 1933 80 y.o. Admit date: 03/30/2014 D/C date:     03/31/2014  Primary Cardiologist: Dr. Katrinka Blazing   Principal Problem:   Non-cardiac chest pain Active Problems:   HTN (hypertension)   HLD (hyperlipidemia)   Diabetes mellitus   History of echocardiogram   GERD (gastroesophageal reflux disease)   Discharge Diagnosis: Non cardiac chest/ back pain - likely due to GERD s/p low risk NST and normal ECHO  HPI: [name] is a 80 y.o. female with no past medical hx by virtue of no medical care who presented to her husband's PCP yesterday complaining of back pain and chest pain. She was referred to the ED for further evaluation.   Until recently she has not seen a doctor for a very long time. She reported developing severe, 10/10 back pain the day prior to admission between her shoulder blades and lesser pain in the right and left sides of her chest. The CP was relieved with belching but never completely resoleved. No dyspnea or other symptoms. She also reported numbness and tingling in her feet.   Hospital Course: She was admitted for further work up and evaluation with nuclear stress test and ECHO. She ruled out for MI overnight with negative cardiac markers and stable ECG. However, she was noted to have an extremely elevated TSH as well as HgA1c. Her lipid panel returned markedly abnormal as well.   Chest pain- troponin neg x2. ECG with no acute ST or TW changes.  -- CTA with no aneurysm, dissection or PE.  -- Lexiscan myoview: overall low risk pharmacologic nuclear stress test. Cannot exclude a very small amount of ischemia along the mid lateral distribution. Normal ejection fraction. It was decided to treat her medically at this point with cardiology follow up in 1 month.  -- Continue ASA, BB and statin  -- Continue protonix   Newly diagnosed DM- HgA1c 11. She was placed on SSI while inpatient  -- Diabetes  coordinator saw her and recommended metformin 500 mg BID. This will be started on 04/02/14 (at least 48 hours after contrast dye exposure from CTA) -- I provided her with a written Rx for a glucometer, diabetic test strips and diabetic lancets.  -- Patient told diabetes coordinator she had been eating a lot of food lately to try to gain weight (large milkshakes, regular coke, and large servings). If patient makes dietary changes along with oral medications, patient may be able to profoundly reduce her blood sugars. -- This will need to be followed closely by PCP  Elevated TSH- markedly elevated at 25. Will add free T3/T4 ( still pending at the time of discharge) -- CTA with "inhomogeneity in the thyroid. Nonemergent thyroid ultrasound advised to further assess." Will need to follow up with primary care.   HLD- uncontrolled. TC 218, TG 778; HDL 23. Added a statin. This will hopefully improve with DM medications and thyroid repletion.   HTN- no hx of HTN. Blood pressures well controlled on lopressor 12.5mg  BID.  The patient has had an uncomplicated hospital course and is recovering well. She has been seen by Dr. Patty Sermons today and deemed ready for discharge home. All follow-up appointments have been scheduled.  Discharge medications include ASA 81mg , Lipitor 20mg , metoprolol tartrate 12.5 mg BID, SL NTG, protonix 40mg  and metformin 500mg  BID (starting 04/02/14).  This summary will be forwarded to her PCP as they are not on EPIC.   Discharge Vitals: Blood  pressure 120/87, pulse 87, temperature 98 F (36.7 C), temperature source Oral, resp. rate 17, height 5\' 3"  (1.6 m), weight 155 lb 4.8 oz (70.444 kg), SpO2 97.00%.  Labs: Lab Results  Component Value Date   WBC 9.4 03/31/2014   HGB 13.3 03/31/2014   HCT 38.8 03/31/2014   MCV 92.6 03/31/2014   PLT 198 03/31/2014     Recent Labs Lab 03/30/14 1523 03/31/14 0126  NA 134* 137  K 3.7 3.5*  CL 96 99  CO2 21 24  BUN 9 10  CREATININE 0.49* 0.58   CALCIUM 9.5 8.8  PROT 7.9  --   BILITOT 0.7  --   ALKPHOS 80  --   ALT 24  --   AST 22  --   GLUCOSE 325* 260*    Recent Labs  03/30/14 2105 03/31/14 0126 03/31/14 1145  TROPONINI <0.30 <0.30 <0.30   Lab Results  Component Value Date   CHOL 218* 03/31/2014   HDL 23* 03/31/2014   LDLCALC UNABLE TO CALCULATE IF TRIGLYCERIDE OVER 400 mg/dL 1/61/09607/14/2015   TRIG 454778* 0/98/11917/14/2015     Diagnostic Studies/Procedures   Dg Chest 2 View  03/30/2014   CLINICAL DATA:  LEFT side chest and back pain which began at 12:30 a.m.  EXAM: CHEST  2 VIEW  COMPARISON:  None  FINDINGS: Upper normal heart size.  Calcified tortuous aorta.  Mediastinal contours and pulmonary vascularity normal.  Bronchitic and emphysematous changes consistent with COPD.  No acute infiltrate, pleural effusion or pneumothorax.  Bones diffusely demineralized.  Surgical clips RIGHT upper quadrant question cholecystectomy.  IMPRESSION: COPD changes.  No acute abnormalities.     Nm Myocar Multi W/spect W/wall Motion / Ef  03/31/2014   CLINICAL DATA:  80 year old with diabetes, atypical chest pain/back pain.  EXAM: MYOCARDIAL IMAGING WITH SPECT (REST AND PHARMACOLOGIC-STRESS)  GATED LEFT VENTRICULAR WALL MOTION STUDY  LEFT VENTRICULAR EJECTION FRACTION  TECHNIQUE: Standard myocardial SPECT imaging was performed after resting intravenous injection of 10 mCi [id number] sestamibi. Subsequently, intravenous infusion of Lexiscan was performed under the supervision of the Cardiology staff. At peak effect of the drug, 30 mCi [id number] sestamibi was injected intravenously and standard myocardial SPECT imaging was performed. Quantitative gated imaging was also performed to evaluate left ventricular wall motion, and estimate left ventricular ejection fraction.  COMPARISON:  None.  FINDINGS: There is very subtle decreased uptake slightly worse at stress than at rest along the mid lateral distribution. Mild reversibility present. Otherwise, homogeneous  radiotracer uptake. A small degree of ischemia cannot be excluded. Normal left ventricular ejection fraction calculated greater than 65% with no wall motion abnormalities.  IMPRESSION: Overall low risk pharmacologic nuclear stress test. Cannot exclude a very small amount of ischemia along the mid lateral distribution. Normal ejection fraction.      Ct Angio Chest Aortic Dissect W &/or W/o  03/30/2014   CLINICAL DATA:  Chest pain  EXAM: CT ANGIOGRAPHY CHEST WITH CONTRAST  TECHNIQUE: Initially, axial CT images were obtained through the chest without intravenous contrast material. Multidetector CT imaging of the chest was performed using the standard protocol during bolus administration of intravenous contrast. Multiplanar CT image reconstructions and MIPs were obtained to evaluate the vascular anatomy.  CONTRAST:  100mL OMNIPAQUE IOHEXOL 350 MG/ML SOLN  COMPARISON:  Chest radiograph March 30, 2014  FINDINGS: There is atherosclerotic change in the aortic arch region. No aneurysm or dissection is seen. The right common carotid artery and right subclavian artery arise from a common trunk. There  is no appreciable narrowing of the proximal great vessels.  There is no demonstrable pulmonary embolus.  There is underlying centrilobular emphysematous change. There is patchy lower lobe atelectatic change bilaterally. There is no edema or consolidation. There is no appreciable thoracic adenopathy. Pericardium is not thickened. There is a small hiatal hernia.  In the visualized upper abdomen, there is fatty change in the liver. There is incomplete visualization of a cyst arising from the medial upper pole of the left kidney measuring 3.0 x 2.5 cm. There are no blastic or lytic bone lesions. Thyroid appears somewhat inhomogeneous.  Review of the MIP images confirms the above findings.  IMPRESSION: No aneurysm or dissection in the thoracic aorta. No demonstrable pulmonary embolus. Underlying emphysematous change. No edema or  consolidation. Small hiatal hernia. Fatty liver.  There is inhomogeneity in the thyroid. Nonemergent thyroid ultrasound advised to further assess.      2D ECHO: 03/31/2014 LV EF: 60% - 65% Study Conclusions - Left ventricle: The cavity size was normal. Wall thickness was normal. Systolic function was normal. The estimated ejection fraction was in the range of 60% to 65%. Wall motion was normal; there were no regional wall motion abnormalities. Doppler parameters are consistent with abnormal left ventricular relaxation (grade 1 diastolic dysfunction). - Left atrium: The atrium was mildly dilated (35 ml/m2).    Discharge Medications     Medication List         acetaminophen 500 MG tablet  Commonly known as:  TYLENOL  Take 1,000 mg by mouth every 6 (six) hours as needed for headache (pain).     aspirin 81 MG EC tablet  Take 1 tablet (81 mg total) by mouth daily.     atorvastatin 20 MG tablet  Commonly known as:  LIPITOR  Take 1 tablet (20 mg total) by mouth daily at 6 PM.     DRAMAMINE PO  Take 1 tablet by mouth at bedtime as needed (inner ear).     metFORMIN 500 MG tablet  Commonly known as:  GLUCOPHAGE  Take 1 tablet (500 mg total) by mouth 2 (two) times daily with a meal.  Start taking on:  04/02/2014     metoprolol tartrate 12.5 mg Tabs tablet  Commonly known as:  LOPRESSOR  Take 0.5 tablets (12.5 mg total) by mouth 2 (two) times daily.     nitroGLYCERIN 0.4 MG SL tablet  Commonly known as:  NITROSTAT  Place 1 tablet (0.4 mg total) under the tongue every 5 (five) minutes x 3 doses as needed for chest pain.     pantoprazole 40 MG tablet  Commonly known as:  PROTONIX  Take 1 tablet (40 mg total) by mouth daily.        Disposition   The patient will be discharged in stable condition to home. Discharge Instructions   Ambulatory referral to Nutrition and Diabetic Education    Complete by:  As directed   New diagnosis of DM.  A1c 11%.  PCP is Dr. Eloise Harman with  [facility].          Follow-up Information   Follow up with Willis Modena, NP On 04/06/2014. (@ 10am. This is a Publishing rights manager who works with Dr. Eloise Harman )    Specialty:  Family Medicine   Contact information:   [address]   [place] [address] [phone]       Follow up with Tereso Newcomer, PA-C On 04/27/2014. (@ 2:20pm)    Specialty:  Physician Assistant  Contact information:   [address] [place] [address] [phone]         Duration of Discharge Encounter: Greater than 30 minutes including physician and PA time.  Johnny Bridge, KATHRYN PA-C 03/31/2014, 5:27 PM

## 2014-04-01 NOTE — ED Provider Notes (Signed)
Medical screening examination/treatment/procedure(s) were conducted as a shared visit with non-physician practitioner(s) and myself.  I personally evaluated the patient during the encounter.   EKG Interpretation   Date/Time:  Monday March 30 2014 14:49:13 EDT Ventricular Rate:  93 PR Interval:  170 QRS Duration: 74 QT Interval:  372 QTC Calculation: 462 R Axis:   -46 Text Interpretation:  Normal sinus rhythm Left axis deviation Abnormal ECG  Confirmed by Jaisa Defino, MD, Winslow Ederer ([id number]) on 03/30/2014 3:48:59 PM       Pt with non specific chest pain. Also noted to have new DM. Pt on exam has no acute cardiopulm decompensation and EKG shows no acute findings. Cards consulted for the chest pain - and they agreed to admit.   Derwood KaplanAnkit Whitaker Holderman, MD 04/01/14 [phone]

## 2014-04-06 ENCOUNTER — Telehealth: Payer: Self-pay | Admitting: Interventional Cardiology

## 2014-04-06 LAB — URINE CULTURE

## 2014-04-06 NOTE — Telephone Encounter (Signed)
Error

## 2014-04-17 ENCOUNTER — Encounter: Payer: Self-pay | Admitting: *Deleted

## 2014-04-21 ENCOUNTER — Ambulatory Visit: Payer: Commercial Managed Care - HMO

## 2014-04-23 ENCOUNTER — Ambulatory Visit: Payer: Commercial Managed Care - HMO

## 2014-04-27 ENCOUNTER — Ambulatory Visit (INDEPENDENT_AMBULATORY_CARE_PROVIDER_SITE_OTHER): Payer: Commercial Managed Care - HMO | Admitting: Physician Assistant

## 2014-04-27 ENCOUNTER — Encounter: Payer: Self-pay | Admitting: Physician Assistant

## 2014-04-27 VITALS — BP 130/70 | HR 72 | Ht 63.0 in | Wt 151.0 lb

## 2014-04-27 DIAGNOSIS — I10 Essential (primary) hypertension: Secondary | ICD-10-CM

## 2014-04-27 DIAGNOSIS — E119 Type 2 diabetes mellitus without complications: Secondary | ICD-10-CM

## 2014-04-27 DIAGNOSIS — E785 Hyperlipidemia, unspecified: Secondary | ICD-10-CM

## 2014-04-27 DIAGNOSIS — K219 Gastro-esophageal reflux disease without esophagitis: Secondary | ICD-10-CM

## 2014-04-27 DIAGNOSIS — R0789 Other chest pain: Secondary | ICD-10-CM

## 2014-04-27 NOTE — Patient Instructions (Signed)
Your physician recommends that you schedule a follow-up appointment as needed  

## 2014-04-27 NOTE — Progress Notes (Signed)
Cardiology Office Note    Date:  04/27/2014   ID:  Terri ClamRebecca Duncan, DOB 11/17/1932, MRN [id number]  PCP:  Garlan FillersPATERSON,DANIEL G, MD  Cardiologist:  Dr. Verdis PrimeHenry Smith      History of Present Illness: [name] is a 80 y.o. female who was recently admitted 7/13-7/14 with chest pain. Patient had not had medical care in many years. In the hospital she had negative cardiac markers. Initial lab work did demonstrate an elevated TSH and elevated hemoglobin A1c as well as a markedly abnormal lipid panel. Chest CT was negative for aneurysm, dissection or pulmonary embolism. Inpatient Myoview was low risk. Small amount of ischemia along the mid lateral distribution could not be ruled out. EF was normal. Echocardiogram demonstrated normal LV function. She was seen by the diabetes coordinator and was placed on metformin. She was placed on Lipitor for control of her lipids and metoprolol for her blood pressure. She was asked to follow up with primary care for her hypothyroidism.    She returns for follow up.  She was placed on synthroid by her PCP.  She is doing well.  The patient denies chest pain, shortness of breath, syncope, orthopnea, PND or significant pedal edema.    Studies:  - Echo (7/15):  EF 60-65%, normal wall motion, grade 1 diastolic dysfunction, mild LAE    Nm Myocar Multi W/spect W/wall Motion / Ef  03/31/2014      IMPRESSION: Overall low risk pharmacologic nuclear stress test. Cannot exclude a very small amount of ischemia along the mid lateral distribution. Normal ejection fraction.   Electronically Signed   By: Donato SchultzMark  Skains   On: 03/31/2014 15:09    Recent Labs/Images: 03/30/2014: ALT 24; TSH 25.010*  03/31/2014: Creatinine 0.58; HDL Cholesterol by NMR 23*; Hemoglobin 13.3; LDL (calc) UNABLE TO CALCULATE IF TRIGLYCERIDE OVER 400 mg/dL; Potassium 3.5*   Dg Chest 2 View  03/30/2014     IMPRESSION: COPD changes.  No acute abnormalities.   Electronically Signed   By: Ulyses SouthwardMark  Boles M.D.   On:  03/30/2014 16:15   Ct Angio Chest Aortic Dissect W &/or W/o  03/30/2014     IMPRESSION: No aneurysm or dissection in the thoracic aorta. No demonstrable pulmonary embolus. Underlying emphysematous change. No edema or consolidation. Small hiatal hernia. Fatty liver.  There is inhomogeneity in the thyroid. Nonemergent thyroid ultrasound advised to further assess.   Electronically Signed   By: Bretta BangWilliam  Woodruff M.D.   On: 03/30/2014 19:30      Wt Readings from Last 3 Encounters:  03/31/14 155 lb 4.8 oz (70.444 kg)     Past Medical History  Diagnosis Date  . Diabetes mellitus   . HLD (hyperlipidemia)   . HTN (hypertension)   . History of echocardiogram     a. ECHO 03/31/14 EF 60-65%. No WMA. G1DD. mild LA dilation.   Marland Kitchen. GERD (gastroesophageal reflux disease)     Current Outpatient Prescriptions  Medication Sig Dispense Refill  . acetaminophen (TYLENOL) 500 MG tablet Take 1,000 mg by mouth every 6 (six) hours as needed for headache (pain).      Marland Kitchen. aspirin EC 81 MG EC tablet Take 1 tablet (81 mg total) by mouth daily.  30 tablet    . atorvastatin (LIPITOR) 20 MG tablet Take 1 tablet (20 mg total) by mouth daily at 6 PM.  30 tablet  11  . DimenhyDRINATE (DRAMAMINE PO) Take 1 tablet by mouth at bedtime as needed (inner ear).      .Marland Kitchen  metFORMIN (GLUCOPHAGE) 500 MG tablet Take 1 tablet (500 mg total) by mouth 2 (two) times daily with a meal.  60 tablet  11  . metoprolol tartrate (LOPRESSOR) 12.5 mg TABS tablet Take 0.5 tablets (12.5 mg total) by mouth 2 (two) times daily.  30 tablet  11  . nitroGLYCERIN (NITROSTAT) 0.4 MG SL tablet Place 1 tablet (0.4 mg total) under the tongue every 5 (five) minutes x 3 doses as needed for chest pain.  25 tablet  12  . pantoprazole (PROTONIX) 40 MG tablet Take 1 tablet (40 mg total) by mouth daily.  30 tablet  11   No current facility-administered medications for this visit.     Allergies:   Codeine   Social History:  The patient  reports that she has never  smoked. She does not have any smokeless tobacco history on file. She reports that she drinks alcohol.   Family History:  The patient's family history includes Rheumatic fever (age of onset: 81) in her mother.   ROS:  Please see the history of present illness.      All other systems reviewed and negative.   PHYSICAL EXAM: VS:  BP 130/70  Pulse 72  Ht 5\' 3"  (1.6 m)  Wt 151 lb (68.493 kg)  BMI 26.76 kg/m2 Well nourished, well developed, in no acute distress HEENT: normal Neck: no JVD Vascular:  No carotid bruits Cardiac:  normal S1, S2; RRR; no murmur Lungs:  clear to auscultation bilaterally, no wheezing, rhonchi or rales Abd: soft, nontender, no hepatomegaly Ext: no edema Skin: warm and dry Neuro:  CNs 2-12 intact, no focal abnormalities noted  EKG:  NSR, HR 72, normal axis, NSSTTW changes.       ASSESSMENT AND PLAN:  Chest pain:  No recurrence. Chest CT was negative for pulmonary embolism or dissection. Stress test was low risk. Echocardiogram was normal. Cardiac enzymes are normal.  No further workup indicated. She has significant risk factors for coronary artery disease. Continue aspirin, statin, beta blocker.  Essential hypertension:  Controlled.  Type 2 diabetes mellitus without complication:  Continue followup with primary care.  HLD (hyperlipidemia):  Continue metoprolol. Followup primary care.  Gastroesophageal reflux disease, esophagitis presence not specified:  Continue PPI.  Hypothyroidism:  Follow up with primary care.   Disposition:  F/u with Dr. Verdis Prime as needed.    Signed, Brynda Rim, MHS 04/27/2014 2:05 PM    [facility] [address] [place], [place], [place]  [address] Phone: [phone]; Fax: [phone]

## 2014-04-30 ENCOUNTER — Ambulatory Visit: Payer: Commercial Managed Care - HMO

## 2014-05-07 ENCOUNTER — Ambulatory Visit: Payer: Commercial Managed Care - HMO

## 2014-05-22 ENCOUNTER — Other Ambulatory Visit: Payer: Self-pay | Admitting: Physician Assistant

## 2014-05-31 ENCOUNTER — Other Ambulatory Visit: Payer: Self-pay | Admitting: Interventional Cardiology

## 2014-10-22 DIAGNOSIS — I739 Peripheral vascular disease, unspecified: Secondary | ICD-10-CM | POA: Diagnosis not present

## 2014-10-22 DIAGNOSIS — E1151 Type 2 diabetes mellitus with diabetic peripheral angiopathy without gangrene: Secondary | ICD-10-CM | POA: Diagnosis not present

## 2014-10-22 DIAGNOSIS — H8110 Benign paroxysmal vertigo, unspecified ear: Secondary | ICD-10-CM | POA: Diagnosis not present

## 2014-10-22 DIAGNOSIS — E119 Type 2 diabetes mellitus without complications: Secondary | ICD-10-CM | POA: Diagnosis not present

## 2014-10-22 DIAGNOSIS — Z6826 Body mass index (BMI) 26.0-26.9, adult: Secondary | ICD-10-CM | POA: Diagnosis not present

## 2014-10-22 DIAGNOSIS — I10 Essential (primary) hypertension: Secondary | ICD-10-CM | POA: Diagnosis not present

## 2015-01-04 DIAGNOSIS — E039 Hypothyroidism, unspecified: Secondary | ICD-10-CM | POA: Diagnosis not present

## 2015-01-04 DIAGNOSIS — N390 Urinary tract infection, site not specified: Secondary | ICD-10-CM | POA: Diagnosis not present

## 2015-01-04 DIAGNOSIS — E119 Type 2 diabetes mellitus without complications: Secondary | ICD-10-CM | POA: Diagnosis not present

## 2015-01-04 DIAGNOSIS — E785 Hyperlipidemia, unspecified: Secondary | ICD-10-CM | POA: Diagnosis not present

## 2015-01-04 DIAGNOSIS — Z0000 Encounter for general adult medical examination without abnormal findings: Secondary | ICD-10-CM | POA: Diagnosis not present

## 2015-01-07 DIAGNOSIS — Z1231 Encounter for screening mammogram for malignant neoplasm of breast: Secondary | ICD-10-CM | POA: Diagnosis not present

## 2015-01-07 DIAGNOSIS — Z803 Family history of malignant neoplasm of breast: Secondary | ICD-10-CM | POA: Diagnosis not present

## 2015-01-11 DIAGNOSIS — I739 Peripheral vascular disease, unspecified: Secondary | ICD-10-CM | POA: Diagnosis not present

## 2015-01-11 DIAGNOSIS — E039 Hypothyroidism, unspecified: Secondary | ICD-10-CM | POA: Diagnosis not present

## 2015-01-11 DIAGNOSIS — E1151 Type 2 diabetes mellitus with diabetic peripheral angiopathy without gangrene: Secondary | ICD-10-CM | POA: Diagnosis not present

## 2015-01-11 DIAGNOSIS — Z1389 Encounter for screening for other disorder: Secondary | ICD-10-CM | POA: Diagnosis not present

## 2015-01-11 DIAGNOSIS — E785 Hyperlipidemia, unspecified: Secondary | ICD-10-CM | POA: Diagnosis not present

## 2015-01-11 DIAGNOSIS — I10 Essential (primary) hypertension: Secondary | ICD-10-CM | POA: Diagnosis not present

## 2015-01-11 DIAGNOSIS — Z6827 Body mass index (BMI) 27.0-27.9, adult: Secondary | ICD-10-CM | POA: Diagnosis not present

## 2015-01-11 DIAGNOSIS — Z0000 Encounter for general adult medical examination without abnormal findings: Secondary | ICD-10-CM | POA: Diagnosis not present

## 2015-01-11 DIAGNOSIS — M1990 Unspecified osteoarthritis, unspecified site: Secondary | ICD-10-CM | POA: Diagnosis not present

## 2015-01-12 DIAGNOSIS — Z1212 Encounter for screening for malignant neoplasm of rectum: Secondary | ICD-10-CM | POA: Diagnosis not present

## 2015-01-13 DIAGNOSIS — H5210 Myopia, unspecified eye: Secondary | ICD-10-CM | POA: Diagnosis not present

## 2015-01-13 DIAGNOSIS — H524 Presbyopia: Secondary | ICD-10-CM | POA: Diagnosis not present

## 2015-01-13 DIAGNOSIS — E109 Type 1 diabetes mellitus without complications: Secondary | ICD-10-CM | POA: Diagnosis not present

## 2015-01-13 DIAGNOSIS — E784 Other hyperlipidemia: Secondary | ICD-10-CM | POA: Diagnosis not present

## 2015-05-10 DIAGNOSIS — E1151 Type 2 diabetes mellitus with diabetic peripheral angiopathy without gangrene: Secondary | ICD-10-CM | POA: Diagnosis not present

## 2015-05-10 DIAGNOSIS — I739 Peripheral vascular disease, unspecified: Secondary | ICD-10-CM | POA: Diagnosis not present

## 2015-05-10 DIAGNOSIS — R42 Dizziness and giddiness: Secondary | ICD-10-CM | POA: Diagnosis not present

## 2015-05-10 DIAGNOSIS — I10 Essential (primary) hypertension: Secondary | ICD-10-CM | POA: Diagnosis not present

## 2015-05-10 DIAGNOSIS — Z6826 Body mass index (BMI) 26.0-26.9, adult: Secondary | ICD-10-CM | POA: Diagnosis not present

## 2015-06-28 ENCOUNTER — Other Ambulatory Visit: Payer: Self-pay | Admitting: Interventional Cardiology

## 2015-07-01 ENCOUNTER — Other Ambulatory Visit: Payer: Self-pay | Admitting: Interventional Cardiology

## 2015-09-07 ENCOUNTER — Other Ambulatory Visit: Payer: Self-pay | Admitting: Interventional Cardiology

## 2015-09-07 MED ORDER — METOPROLOL TARTRATE 25 MG PO TABS: 12.5000 mg | ORAL_TABLET | Freq: Two times a day (BID) | ORAL | Status: DC

## 2015-10-11 ENCOUNTER — Other Ambulatory Visit: Payer: Self-pay | Admitting: Interventional Cardiology

## 2015-10-12 NOTE — Telephone Encounter (Signed)
Yes

## 2015-10-12 NOTE — Telephone Encounter (Signed)
Patient is prn follow up. Should this be deferred to pcp for refill?

## 2015-10-17 ENCOUNTER — Other Ambulatory Visit: Payer: Self-pay | Admitting: Interventional Cardiology

## 2015-11-04 DIAGNOSIS — E784 Other hyperlipidemia: Secondary | ICD-10-CM | POA: Diagnosis not present

## 2015-11-04 DIAGNOSIS — H8110 Benign paroxysmal vertigo, unspecified ear: Secondary | ICD-10-CM | POA: Diagnosis not present

## 2015-11-04 DIAGNOSIS — E1151 Type 2 diabetes mellitus with diabetic peripheral angiopathy without gangrene: Secondary | ICD-10-CM | POA: Diagnosis not present

## 2015-11-04 DIAGNOSIS — I7389 Other specified peripheral vascular diseases: Secondary | ICD-10-CM | POA: Diagnosis not present

## 2015-11-04 DIAGNOSIS — Z1389 Encounter for screening for other disorder: Secondary | ICD-10-CM | POA: Diagnosis not present

## 2015-11-04 DIAGNOSIS — Z6826 Body mass index (BMI) 26.0-26.9, adult: Secondary | ICD-10-CM | POA: Diagnosis not present

## 2016-01-24 ENCOUNTER — Other Ambulatory Visit: Payer: Self-pay | Admitting: Interventional Cardiology

## 2016-01-28 ENCOUNTER — Other Ambulatory Visit: Payer: Self-pay | Admitting: Interventional Cardiology

## 2016-02-01 DIAGNOSIS — Z1231 Encounter for screening mammogram for malignant neoplasm of breast: Secondary | ICD-10-CM | POA: Diagnosis not present

## 2016-03-02 DIAGNOSIS — E1151 Type 2 diabetes mellitus with diabetic peripheral angiopathy without gangrene: Secondary | ICD-10-CM | POA: Diagnosis not present

## 2016-03-02 DIAGNOSIS — E038 Other specified hypothyroidism: Secondary | ICD-10-CM | POA: Diagnosis not present

## 2016-03-02 DIAGNOSIS — E784 Other hyperlipidemia: Secondary | ICD-10-CM | POA: Diagnosis not present

## 2016-03-02 DIAGNOSIS — I10 Essential (primary) hypertension: Secondary | ICD-10-CM | POA: Diagnosis not present

## 2016-03-09 DIAGNOSIS — F5104 Psychophysiologic insomnia: Secondary | ICD-10-CM | POA: Diagnosis not present

## 2016-03-09 DIAGNOSIS — E784 Other hyperlipidemia: Secondary | ICD-10-CM | POA: Diagnosis not present

## 2016-03-09 DIAGNOSIS — E038 Other specified hypothyroidism: Secondary | ICD-10-CM | POA: Diagnosis not present

## 2016-03-09 DIAGNOSIS — I10 Essential (primary) hypertension: Secondary | ICD-10-CM | POA: Diagnosis not present

## 2016-03-09 DIAGNOSIS — Z1389 Encounter for screening for other disorder: Secondary | ICD-10-CM | POA: Diagnosis not present

## 2016-03-09 DIAGNOSIS — I7389 Other specified peripheral vascular diseases: Secondary | ICD-10-CM | POA: Diagnosis not present

## 2016-03-09 DIAGNOSIS — E1151 Type 2 diabetes mellitus with diabetic peripheral angiopathy without gangrene: Secondary | ICD-10-CM | POA: Diagnosis not present

## 2016-03-09 DIAGNOSIS — Z0000 Encounter for general adult medical examination without abnormal findings: Secondary | ICD-10-CM | POA: Diagnosis not present

## 2016-03-09 DIAGNOSIS — Z6826 Body mass index (BMI) 26.0-26.9, adult: Secondary | ICD-10-CM | POA: Diagnosis not present

## 2016-04-26 DIAGNOSIS — H5210 Myopia, unspecified eye: Secondary | ICD-10-CM | POA: Diagnosis not present

## 2016-04-26 DIAGNOSIS — H52209 Unspecified astigmatism, unspecified eye: Secondary | ICD-10-CM | POA: Diagnosis not present

## 2016-04-26 DIAGNOSIS — E119 Type 2 diabetes mellitus without complications: Secondary | ICD-10-CM | POA: Diagnosis not present

## 2016-04-26 DIAGNOSIS — E7800 Pure hypercholesterolemia, unspecified: Secondary | ICD-10-CM | POA: Diagnosis not present

## 2016-05-01 DIAGNOSIS — E038 Other specified hypothyroidism: Secondary | ICD-10-CM | POA: Diagnosis not present

## 2016-07-07 DIAGNOSIS — I7389 Other specified peripheral vascular diseases: Secondary | ICD-10-CM | POA: Diagnosis not present

## 2016-07-07 DIAGNOSIS — M545 Low back pain: Secondary | ICD-10-CM | POA: Diagnosis not present

## 2016-07-07 DIAGNOSIS — Z6825 Body mass index (BMI) 25.0-25.9, adult: Secondary | ICD-10-CM | POA: Diagnosis not present

## 2016-07-07 DIAGNOSIS — H8113 Benign paroxysmal vertigo, bilateral: Secondary | ICD-10-CM | POA: Diagnosis not present

## 2016-07-07 DIAGNOSIS — I10 Essential (primary) hypertension: Secondary | ICD-10-CM | POA: Diagnosis not present

## 2016-07-07 DIAGNOSIS — E038 Other specified hypothyroidism: Secondary | ICD-10-CM | POA: Diagnosis not present

## 2016-07-07 DIAGNOSIS — E1151 Type 2 diabetes mellitus with diabetic peripheral angiopathy without gangrene: Secondary | ICD-10-CM | POA: Diagnosis not present

## 2016-10-09 DIAGNOSIS — E1151 Type 2 diabetes mellitus with diabetic peripheral angiopathy without gangrene: Secondary | ICD-10-CM | POA: Diagnosis not present

## 2016-10-09 DIAGNOSIS — M545 Low back pain: Secondary | ICD-10-CM | POA: Diagnosis not present

## 2016-10-09 DIAGNOSIS — Z6825 Body mass index (BMI) 25.0-25.9, adult: Secondary | ICD-10-CM | POA: Diagnosis not present

## 2016-10-09 DIAGNOSIS — Z1389 Encounter for screening for other disorder: Secondary | ICD-10-CM | POA: Diagnosis not present

## 2016-10-09 DIAGNOSIS — I739 Peripheral vascular disease, unspecified: Secondary | ICD-10-CM | POA: Diagnosis not present

## 2016-10-09 DIAGNOSIS — I10 Essential (primary) hypertension: Secondary | ICD-10-CM | POA: Diagnosis not present

## 2017-02-08 DIAGNOSIS — E784 Other hyperlipidemia: Secondary | ICD-10-CM | POA: Diagnosis not present

## 2017-02-08 DIAGNOSIS — E1151 Type 2 diabetes mellitus with diabetic peripheral angiopathy without gangrene: Secondary | ICD-10-CM | POA: Diagnosis not present

## 2017-02-08 DIAGNOSIS — I739 Peripheral vascular disease, unspecified: Secondary | ICD-10-CM | POA: Diagnosis not present

## 2017-02-08 DIAGNOSIS — I10 Essential (primary) hypertension: Secondary | ICD-10-CM | POA: Diagnosis not present

## 2017-02-08 DIAGNOSIS — M545 Low back pain: Secondary | ICD-10-CM | POA: Diagnosis not present

## 2017-02-08 DIAGNOSIS — E038 Other specified hypothyroidism: Secondary | ICD-10-CM | POA: Diagnosis not present

## 2017-02-08 DIAGNOSIS — Z6825 Body mass index (BMI) 25.0-25.9, adult: Secondary | ICD-10-CM | POA: Diagnosis not present

## 2017-06-11 DIAGNOSIS — E119 Type 2 diabetes mellitus without complications: Secondary | ICD-10-CM | POA: Diagnosis not present

## 2017-06-11 DIAGNOSIS — E7800 Pure hypercholesterolemia, unspecified: Secondary | ICD-10-CM | POA: Diagnosis not present

## 2017-06-11 DIAGNOSIS — H52209 Unspecified astigmatism, unspecified eye: Secondary | ICD-10-CM | POA: Diagnosis not present

## 2017-06-19 DIAGNOSIS — Z6824 Body mass index (BMI) 24.0-24.9, adult: Secondary | ICD-10-CM | POA: Diagnosis not present

## 2017-06-19 DIAGNOSIS — I7389 Other specified peripheral vascular diseases: Secondary | ICD-10-CM | POA: Diagnosis not present

## 2017-06-19 DIAGNOSIS — I10 Essential (primary) hypertension: Secondary | ICD-10-CM | POA: Diagnosis not present

## 2017-06-19 DIAGNOSIS — E1151 Type 2 diabetes mellitus with diabetic peripheral angiopathy without gangrene: Secondary | ICD-10-CM | POA: Diagnosis not present

## 2017-06-19 DIAGNOSIS — E7849 Other hyperlipidemia: Secondary | ICD-10-CM | POA: Diagnosis not present

## 2017-09-25 DIAGNOSIS — E7849 Other hyperlipidemia: Secondary | ICD-10-CM | POA: Diagnosis not present

## 2017-09-25 DIAGNOSIS — E038 Other specified hypothyroidism: Secondary | ICD-10-CM | POA: Diagnosis not present

## 2017-09-25 DIAGNOSIS — E1151 Type 2 diabetes mellitus with diabetic peripheral angiopathy without gangrene: Secondary | ICD-10-CM | POA: Diagnosis not present

## 2017-09-25 DIAGNOSIS — R82998 Other abnormal findings in urine: Secondary | ICD-10-CM | POA: Diagnosis not present

## 2017-10-12 DIAGNOSIS — Z1212 Encounter for screening for malignant neoplasm of rectum: Secondary | ICD-10-CM | POA: Diagnosis not present

## 2017-10-16 DIAGNOSIS — E7849 Other hyperlipidemia: Secondary | ICD-10-CM | POA: Diagnosis not present

## 2017-10-16 DIAGNOSIS — Z0000 Encounter for general adult medical examination without abnormal findings: Secondary | ICD-10-CM | POA: Diagnosis not present

## 2017-10-16 DIAGNOSIS — E038 Other specified hypothyroidism: Secondary | ICD-10-CM | POA: Diagnosis not present

## 2017-10-16 DIAGNOSIS — I7389 Other specified peripheral vascular diseases: Secondary | ICD-10-CM | POA: Diagnosis not present

## 2017-10-16 DIAGNOSIS — R42 Dizziness and giddiness: Secondary | ICD-10-CM | POA: Diagnosis not present

## 2017-10-16 DIAGNOSIS — Z1389 Encounter for screening for other disorder: Secondary | ICD-10-CM | POA: Diagnosis not present

## 2017-10-16 DIAGNOSIS — Z6824 Body mass index (BMI) 24.0-24.9, adult: Secondary | ICD-10-CM | POA: Diagnosis not present

## 2017-10-16 DIAGNOSIS — I10 Essential (primary) hypertension: Secondary | ICD-10-CM | POA: Diagnosis not present

## 2017-10-16 DIAGNOSIS — E1151 Type 2 diabetes mellitus with diabetic peripheral angiopathy without gangrene: Secondary | ICD-10-CM | POA: Diagnosis not present

## 2018-01-24 DIAGNOSIS — Z6824 Body mass index (BMI) 24.0-24.9, adult: Secondary | ICD-10-CM | POA: Diagnosis not present

## 2018-01-24 DIAGNOSIS — I7389 Other specified peripheral vascular diseases: Secondary | ICD-10-CM | POA: Diagnosis not present

## 2018-01-24 DIAGNOSIS — I10 Essential (primary) hypertension: Secondary | ICD-10-CM | POA: Diagnosis not present

## 2018-01-24 DIAGNOSIS — E7849 Other hyperlipidemia: Secondary | ICD-10-CM | POA: Diagnosis not present

## 2018-01-24 DIAGNOSIS — H8113 Benign paroxysmal vertigo, bilateral: Secondary | ICD-10-CM | POA: Diagnosis not present

## 2018-01-24 DIAGNOSIS — E1151 Type 2 diabetes mellitus with diabetic peripheral angiopathy without gangrene: Secondary | ICD-10-CM | POA: Diagnosis not present

## 2018-07-23 DIAGNOSIS — Z6825 Body mass index (BMI) 25.0-25.9, adult: Secondary | ICD-10-CM | POA: Diagnosis not present

## 2018-07-23 DIAGNOSIS — E038 Other specified hypothyroidism: Secondary | ICD-10-CM | POA: Diagnosis not present

## 2018-07-23 DIAGNOSIS — E7849 Other hyperlipidemia: Secondary | ICD-10-CM | POA: Diagnosis not present

## 2018-07-23 DIAGNOSIS — F5104 Psychophysiologic insomnia: Secondary | ICD-10-CM | POA: Diagnosis not present

## 2018-07-23 DIAGNOSIS — I10 Essential (primary) hypertension: Secondary | ICD-10-CM | POA: Diagnosis not present

## 2018-07-23 DIAGNOSIS — E1151 Type 2 diabetes mellitus with diabetic peripheral angiopathy without gangrene: Secondary | ICD-10-CM | POA: Diagnosis not present

## 2018-10-21 DIAGNOSIS — H04123 Dry eye syndrome of bilateral lacrimal glands: Secondary | ICD-10-CM | POA: Diagnosis not present

## 2018-10-21 DIAGNOSIS — H532 Diplopia: Secondary | ICD-10-CM | POA: Diagnosis not present

## 2018-10-21 DIAGNOSIS — E119 Type 2 diabetes mellitus without complications: Secondary | ICD-10-CM | POA: Diagnosis not present

## 2018-10-21 DIAGNOSIS — H43812 Vitreous degeneration, left eye: Secondary | ICD-10-CM | POA: Diagnosis not present

## 2018-10-21 DIAGNOSIS — H52203 Unspecified astigmatism, bilateral: Secondary | ICD-10-CM | POA: Diagnosis not present

## 2018-10-21 DIAGNOSIS — Z961 Presence of intraocular lens: Secondary | ICD-10-CM | POA: Diagnosis not present

## 2018-10-23 DIAGNOSIS — E1151 Type 2 diabetes mellitus with diabetic peripheral angiopathy without gangrene: Secondary | ICD-10-CM | POA: Diagnosis not present

## 2018-10-23 DIAGNOSIS — E038 Other specified hypothyroidism: Secondary | ICD-10-CM | POA: Diagnosis not present

## 2018-10-23 DIAGNOSIS — R82998 Other abnormal findings in urine: Secondary | ICD-10-CM | POA: Diagnosis not present

## 2018-10-23 DIAGNOSIS — E7849 Other hyperlipidemia: Secondary | ICD-10-CM | POA: Diagnosis not present

## 2018-10-30 DIAGNOSIS — Z1212 Encounter for screening for malignant neoplasm of rectum: Secondary | ICD-10-CM | POA: Diagnosis not present

## 2018-11-01 DIAGNOSIS — H6123 Impacted cerumen, bilateral: Secondary | ICD-10-CM | POA: Diagnosis not present

## 2018-11-01 DIAGNOSIS — F5104 Psychophysiologic insomnia: Secondary | ICD-10-CM | POA: Diagnosis not present

## 2018-11-01 DIAGNOSIS — E7849 Other hyperlipidemia: Secondary | ICD-10-CM | POA: Diagnosis not present

## 2018-11-01 DIAGNOSIS — I10 Essential (primary) hypertension: Secondary | ICD-10-CM | POA: Diagnosis not present

## 2018-11-01 DIAGNOSIS — E1151 Type 2 diabetes mellitus with diabetic peripheral angiopathy without gangrene: Secondary | ICD-10-CM | POA: Diagnosis not present

## 2018-11-01 DIAGNOSIS — Z0000 Encounter for general adult medical examination without abnormal findings: Secondary | ICD-10-CM | POA: Diagnosis not present

## 2018-11-01 DIAGNOSIS — I7389 Other specified peripheral vascular diseases: Secondary | ICD-10-CM | POA: Diagnosis not present

## 2018-11-01 DIAGNOSIS — E038 Other specified hypothyroidism: Secondary | ICD-10-CM | POA: Diagnosis not present

## 2018-11-01 DIAGNOSIS — H8113 Benign paroxysmal vertigo, bilateral: Secondary | ICD-10-CM | POA: Diagnosis not present

## 2019-04-24 DIAGNOSIS — E1151 Type 2 diabetes mellitus with diabetic peripheral angiopathy without gangrene: Secondary | ICD-10-CM | POA: Diagnosis not present

## 2019-04-24 DIAGNOSIS — I10 Essential (primary) hypertension: Secondary | ICD-10-CM | POA: Diagnosis not present

## 2019-04-24 DIAGNOSIS — I739 Peripheral vascular disease, unspecified: Secondary | ICD-10-CM | POA: Diagnosis not present

## 2019-05-06 DIAGNOSIS — Z23 Encounter for immunization: Secondary | ICD-10-CM | POA: Diagnosis not present

## 2019-05-06 DIAGNOSIS — E1151 Type 2 diabetes mellitus with diabetic peripheral angiopathy without gangrene: Secondary | ICD-10-CM | POA: Diagnosis not present

## 2019-08-28 DIAGNOSIS — E785 Hyperlipidemia, unspecified: Secondary | ICD-10-CM | POA: Diagnosis not present

## 2019-08-28 DIAGNOSIS — E039 Hypothyroidism, unspecified: Secondary | ICD-10-CM | POA: Diagnosis not present

## 2019-08-28 DIAGNOSIS — I739 Peripheral vascular disease, unspecified: Secondary | ICD-10-CM | POA: Diagnosis not present

## 2019-08-28 DIAGNOSIS — I10 Essential (primary) hypertension: Secondary | ICD-10-CM | POA: Diagnosis not present

## 2019-08-28 DIAGNOSIS — E1151 Type 2 diabetes mellitus with diabetic peripheral angiopathy without gangrene: Secondary | ICD-10-CM | POA: Diagnosis not present

## 2019-11-04 DIAGNOSIS — Z0000 Encounter for general adult medical examination without abnormal findings: Secondary | ICD-10-CM | POA: Diagnosis not present

## 2019-11-04 DIAGNOSIS — E1151 Type 2 diabetes mellitus with diabetic peripheral angiopathy without gangrene: Secondary | ICD-10-CM | POA: Diagnosis not present

## 2019-11-04 DIAGNOSIS — E038 Other specified hypothyroidism: Secondary | ICD-10-CM | POA: Diagnosis not present

## 2019-11-04 DIAGNOSIS — E7849 Other hyperlipidemia: Secondary | ICD-10-CM | POA: Diagnosis not present

## 2019-11-11 DIAGNOSIS — Z1331 Encounter for screening for depression: Secondary | ICD-10-CM | POA: Diagnosis not present

## 2019-11-11 DIAGNOSIS — E1151 Type 2 diabetes mellitus with diabetic peripheral angiopathy without gangrene: Secondary | ICD-10-CM | POA: Diagnosis not present

## 2019-11-11 DIAGNOSIS — E039 Hypothyroidism, unspecified: Secondary | ICD-10-CM | POA: Diagnosis not present

## 2019-11-11 DIAGNOSIS — I10 Essential (primary) hypertension: Secondary | ICD-10-CM | POA: Diagnosis not present

## 2019-11-11 DIAGNOSIS — E785 Hyperlipidemia, unspecified: Secondary | ICD-10-CM | POA: Diagnosis not present

## 2019-11-11 DIAGNOSIS — I739 Peripheral vascular disease, unspecified: Secondary | ICD-10-CM | POA: Diagnosis not present

## 2019-11-11 DIAGNOSIS — H6122 Impacted cerumen, left ear: Secondary | ICD-10-CM | POA: Diagnosis not present

## 2019-11-11 DIAGNOSIS — Z1339 Encounter for screening examination for other mental health and behavioral disorders: Secondary | ICD-10-CM | POA: Diagnosis not present

## 2019-11-11 DIAGNOSIS — Z0000 Encounter for general adult medical examination without abnormal findings: Secondary | ICD-10-CM | POA: Diagnosis not present

## 2019-11-16 ENCOUNTER — Ambulatory Visit: Payer: Medicare HMO | Attending: Internal Medicine

## 2019-11-16 ENCOUNTER — Other Ambulatory Visit: Payer: Self-pay

## 2019-11-16 DIAGNOSIS — Z23 Encounter for immunization: Secondary | ICD-10-CM | POA: Insufficient documentation

## 2019-11-16 NOTE — Progress Notes (Signed)
   Covid-19 Vaccination Clinic  Name:  [name]    MRN: [id number] DOB: Jun 25, 1933  11/16/2019  [name] was observed post Covid-19 immunization for 15 minutes without incidence. She was provided with Vaccine Information Sheet and instruction to access the V-Safe system.   [name] was instructed to call 911 with any severe reactions post vaccine: Marland Kitchen Difficulty breathing  . Swelling of your face and throat  . A fast heartbeat  . A bad rash all over your body  . Dizziness and weakness    Immunizations Administered    Name Date Dose VIS Date Route   Pfizer COVID-19 Vaccine 11/16/2019  8:57 AM 0.3 mL 08/29/2019 Intramuscular   Manufacturer: [employer], [employer]   Lot: [id number]   NDC: [address]-5

## 2019-12-16 ENCOUNTER — Ambulatory Visit: Payer: Medicare HMO | Attending: Internal Medicine

## 2019-12-16 DIAGNOSIS — Z23 Encounter for immunization: Secondary | ICD-10-CM

## 2019-12-16 NOTE — Progress Notes (Signed)
   Covid-19 Vaccination Clinic  Name:  [name]    MRN: [id number] DOB: 1932/12/30  12/16/2019  [name] was observed post Covid-19 immunization for 15 minutes without incident. She was provided with Vaccine Information Sheet and instruction to access the V-Safe system.   [name] was instructed to call 911 with any severe reactions post vaccine: Marland Kitchen Difficulty breathing  . Swelling of face and throat  . A fast heartbeat  . A bad rash all over body  . Dizziness and weakness   Immunizations Administered    Name Date Dose VIS Date Route   Pfizer COVID-19 Vaccine 12/16/2019 12:51 PM 0.3 mL 08/29/2019 Intramuscular   Manufacturer: [employer], [employer]   Lot: [id number]   NDC: [address]-5

## 2020-01-08 DIAGNOSIS — Z961 Presence of intraocular lens: Secondary | ICD-10-CM | POA: Diagnosis not present

## 2020-01-08 DIAGNOSIS — H52209 Unspecified astigmatism, unspecified eye: Secondary | ICD-10-CM | POA: Diagnosis not present

## 2020-01-08 DIAGNOSIS — H04123 Dry eye syndrome of bilateral lacrimal glands: Secondary | ICD-10-CM | POA: Diagnosis not present

## 2020-01-08 DIAGNOSIS — Z135 Encounter for screening for eye and ear disorders: Secondary | ICD-10-CM | POA: Diagnosis not present

## 2020-01-08 DIAGNOSIS — E119 Type 2 diabetes mellitus without complications: Secondary | ICD-10-CM | POA: Diagnosis not present

## 2020-01-08 DIAGNOSIS — E7800 Pure hypercholesterolemia, unspecified: Secondary | ICD-10-CM | POA: Diagnosis not present

## 2020-01-08 DIAGNOSIS — H43812 Vitreous degeneration, left eye: Secondary | ICD-10-CM | POA: Diagnosis not present

## 2020-03-15 DIAGNOSIS — H6122 Impacted cerumen, left ear: Secondary | ICD-10-CM | POA: Diagnosis not present

## 2020-03-15 DIAGNOSIS — I10 Essential (primary) hypertension: Secondary | ICD-10-CM | POA: Diagnosis not present

## 2020-03-15 DIAGNOSIS — E785 Hyperlipidemia, unspecified: Secondary | ICD-10-CM | POA: Diagnosis not present

## 2020-03-15 DIAGNOSIS — I739 Peripheral vascular disease, unspecified: Secondary | ICD-10-CM | POA: Diagnosis not present

## 2020-03-15 DIAGNOSIS — E039 Hypothyroidism, unspecified: Secondary | ICD-10-CM | POA: Diagnosis not present

## 2020-03-15 DIAGNOSIS — E1151 Type 2 diabetes mellitus with diabetic peripheral angiopathy without gangrene: Secondary | ICD-10-CM | POA: Diagnosis not present

## 2020-07-20 DIAGNOSIS — I739 Peripheral vascular disease, unspecified: Secondary | ICD-10-CM | POA: Diagnosis not present

## 2020-07-20 DIAGNOSIS — E039 Hypothyroidism, unspecified: Secondary | ICD-10-CM | POA: Diagnosis not present

## 2020-07-20 DIAGNOSIS — I10 Essential (primary) hypertension: Secondary | ICD-10-CM | POA: Diagnosis not present

## 2020-07-20 DIAGNOSIS — E1151 Type 2 diabetes mellitus with diabetic peripheral angiopathy without gangrene: Secondary | ICD-10-CM | POA: Diagnosis not present

## 2020-07-20 DIAGNOSIS — H6122 Impacted cerumen, left ear: Secondary | ICD-10-CM | POA: Diagnosis not present

## 2020-07-20 DIAGNOSIS — M1990 Unspecified osteoarthritis, unspecified site: Secondary | ICD-10-CM | POA: Diagnosis not present

## 2020-11-09 DIAGNOSIS — E039 Hypothyroidism, unspecified: Secondary | ICD-10-CM | POA: Diagnosis not present

## 2020-11-09 DIAGNOSIS — E1151 Type 2 diabetes mellitus with diabetic peripheral angiopathy without gangrene: Secondary | ICD-10-CM | POA: Diagnosis not present

## 2020-11-09 DIAGNOSIS — E785 Hyperlipidemia, unspecified: Secondary | ICD-10-CM | POA: Diagnosis not present

## 2020-11-16 DIAGNOSIS — R42 Dizziness and giddiness: Secondary | ICD-10-CM | POA: Diagnosis not present

## 2020-11-16 DIAGNOSIS — E039 Hypothyroidism, unspecified: Secondary | ICD-10-CM | POA: Diagnosis not present

## 2020-11-16 DIAGNOSIS — F5104 Psychophysiologic insomnia: Secondary | ICD-10-CM | POA: Diagnosis not present

## 2020-11-16 DIAGNOSIS — H8110 Benign paroxysmal vertigo, unspecified ear: Secondary | ICD-10-CM | POA: Diagnosis not present

## 2020-11-16 DIAGNOSIS — E785 Hyperlipidemia, unspecified: Secondary | ICD-10-CM | POA: Diagnosis not present

## 2020-11-16 DIAGNOSIS — R82998 Other abnormal findings in urine: Secondary | ICD-10-CM | POA: Diagnosis not present

## 2020-11-16 DIAGNOSIS — Z1212 Encounter for screening for malignant neoplasm of rectum: Secondary | ICD-10-CM | POA: Diagnosis not present

## 2020-11-16 DIAGNOSIS — I739 Peripheral vascular disease, unspecified: Secondary | ICD-10-CM | POA: Diagnosis not present

## 2020-11-16 DIAGNOSIS — I10 Essential (primary) hypertension: Secondary | ICD-10-CM | POA: Diagnosis not present

## 2020-11-16 DIAGNOSIS — M1990 Unspecified osteoarthritis, unspecified site: Secondary | ICD-10-CM | POA: Diagnosis not present

## 2020-11-16 DIAGNOSIS — Z0000 Encounter for general adult medical examination without abnormal findings: Secondary | ICD-10-CM | POA: Diagnosis not present

## 2021-04-20 DIAGNOSIS — E119 Type 2 diabetes mellitus without complications: Secondary | ICD-10-CM | POA: Diagnosis not present

## 2021-04-20 DIAGNOSIS — H35371 Puckering of macula, right eye: Secondary | ICD-10-CM | POA: Diagnosis not present

## 2021-04-20 DIAGNOSIS — Z135 Encounter for screening for eye and ear disorders: Secondary | ICD-10-CM | POA: Diagnosis not present

## 2021-04-20 DIAGNOSIS — H43812 Vitreous degeneration, left eye: Secondary | ICD-10-CM | POA: Diagnosis not present

## 2021-04-20 DIAGNOSIS — Z961 Presence of intraocular lens: Secondary | ICD-10-CM | POA: Diagnosis not present

## 2021-04-20 DIAGNOSIS — H04123 Dry eye syndrome of bilateral lacrimal glands: Secondary | ICD-10-CM | POA: Diagnosis not present

## 2021-04-20 DIAGNOSIS — H52223 Regular astigmatism, bilateral: Secondary | ICD-10-CM | POA: Diagnosis not present

## 2021-05-19 DIAGNOSIS — E039 Hypothyroidism, unspecified: Secondary | ICD-10-CM | POA: Diagnosis not present

## 2021-05-19 DIAGNOSIS — D649 Anemia, unspecified: Secondary | ICD-10-CM | POA: Diagnosis not present

## 2021-05-19 DIAGNOSIS — R7989 Other specified abnormal findings of blood chemistry: Secondary | ICD-10-CM | POA: Diagnosis not present

## 2021-05-19 DIAGNOSIS — R1314 Dysphagia, pharyngoesophageal phase: Secondary | ICD-10-CM | POA: Diagnosis not present

## 2021-05-19 DIAGNOSIS — R634 Abnormal weight loss: Secondary | ICD-10-CM | POA: Diagnosis not present

## 2021-05-19 DIAGNOSIS — E1151 Type 2 diabetes mellitus with diabetic peripheral angiopathy without gangrene: Secondary | ICD-10-CM | POA: Diagnosis not present

## 2021-05-19 DIAGNOSIS — Z23 Encounter for immunization: Secondary | ICD-10-CM | POA: Diagnosis not present

## 2021-05-20 ENCOUNTER — Other Ambulatory Visit: Payer: Self-pay | Admitting: Internal Medicine

## 2021-05-20 DIAGNOSIS — R1314 Dysphagia, pharyngoesophageal phase: Secondary | ICD-10-CM

## 2021-06-08 ENCOUNTER — Ambulatory Visit
Admission: RE | Admit: 2021-06-08 | Discharge: 2021-06-08 | Disposition: A | Discharge status: Home / Self Care | Payer: Medicare HMO | Source: Ambulatory Visit | Attending: Internal Medicine | Admitting: Internal Medicine

## 2021-06-08 DIAGNOSIS — R1314 Dysphagia, pharyngoesophageal phase: Secondary | ICD-10-CM

## 2021-06-08 DIAGNOSIS — R1310 Dysphagia, unspecified: Secondary | ICD-10-CM | POA: Diagnosis not present

## 2021-06-08 DIAGNOSIS — K2289 Other specified disease of esophagus: Secondary | ICD-10-CM | POA: Diagnosis not present

## 2021-07-27 ENCOUNTER — Other Ambulatory Visit: Payer: Self-pay | Admitting: Physician Assistant

## 2021-07-27 DIAGNOSIS — R634 Abnormal weight loss: Secondary | ICD-10-CM | POA: Diagnosis not present

## 2021-07-27 DIAGNOSIS — D519 Vitamin B12 deficiency anemia, unspecified: Secondary | ICD-10-CM

## 2021-07-27 DIAGNOSIS — K639 Disease of intestine, unspecified: Secondary | ICD-10-CM | POA: Diagnosis not present

## 2021-07-27 DIAGNOSIS — K219 Gastro-esophageal reflux disease without esophagitis: Secondary | ICD-10-CM | POA: Diagnosis not present

## 2021-07-27 DIAGNOSIS — R630 Anorexia: Secondary | ICD-10-CM | POA: Diagnosis not present

## 2021-07-27 DIAGNOSIS — D649 Anemia, unspecified: Secondary | ICD-10-CM | POA: Diagnosis not present

## 2021-08-18 DIAGNOSIS — D649 Anemia, unspecified: Secondary | ICD-10-CM | POA: Diagnosis not present

## 2021-08-22 ENCOUNTER — Ambulatory Visit
Admission: RE | Admit: 2021-08-22 | Discharge: 2021-08-22 | Disposition: A | Discharge status: Home / Self Care | Payer: Medicare HMO | Source: Ambulatory Visit | Attending: Physician Assistant | Admitting: Physician Assistant

## 2021-08-22 ENCOUNTER — Other Ambulatory Visit: Payer: Self-pay | Admitting: Physician Assistant

## 2021-08-22 DIAGNOSIS — D519 Vitamin B12 deficiency anemia, unspecified: Secondary | ICD-10-CM

## 2021-08-22 DIAGNOSIS — R634 Abnormal weight loss: Secondary | ICD-10-CM

## 2021-08-22 DIAGNOSIS — K5730 Diverticulosis of large intestine without perforation or abscess without bleeding: Secondary | ICD-10-CM | POA: Diagnosis not present

## 2021-09-14 ENCOUNTER — Other Ambulatory Visit: Payer: Self-pay | Admitting: Physician Assistant

## 2021-09-14 DIAGNOSIS — R634 Abnormal weight loss: Secondary | ICD-10-CM | POA: Diagnosis not present

## 2021-09-14 DIAGNOSIS — Q6100 Congenital renal cyst, unspecified: Secondary | ICD-10-CM | POA: Diagnosis not present

## 2021-09-14 DIAGNOSIS — K219 Gastro-esophageal reflux disease without esophagitis: Secondary | ICD-10-CM | POA: Diagnosis not present

## 2021-09-14 DIAGNOSIS — Z1211 Encounter for screening for malignant neoplasm of colon: Secondary | ICD-10-CM | POA: Diagnosis not present

## 2021-09-14 DIAGNOSIS — N281 Cyst of kidney, acquired: Secondary | ICD-10-CM

## 2021-09-14 DIAGNOSIS — D519 Vitamin B12 deficiency anemia, unspecified: Secondary | ICD-10-CM | POA: Diagnosis not present

## 2021-09-30 ENCOUNTER — Other Ambulatory Visit: Payer: Medicare HMO

## 2021-10-11 ENCOUNTER — Ambulatory Visit
Admission: RE | Admit: 2021-10-11 | Discharge: 2021-10-11 | Disposition: A | Discharge status: Home / Self Care | Payer: Medicare HMO | Source: Ambulatory Visit | Attending: Physician Assistant | Admitting: Physician Assistant

## 2021-10-11 DIAGNOSIS — N281 Cyst of kidney, acquired: Secondary | ICD-10-CM | POA: Diagnosis not present

## 2021-10-11 DIAGNOSIS — Z9049 Acquired absence of other specified parts of digestive tract: Secondary | ICD-10-CM | POA: Diagnosis not present

## 2021-10-20 DIAGNOSIS — K222 Esophageal obstruction: Secondary | ICD-10-CM | POA: Diagnosis not present

## 2021-10-20 DIAGNOSIS — K317 Polyp of stomach and duodenum: Secondary | ICD-10-CM | POA: Diagnosis not present

## 2021-10-20 DIAGNOSIS — K449 Diaphragmatic hernia without obstruction or gangrene: Secondary | ICD-10-CM | POA: Diagnosis not present

## 2021-10-20 DIAGNOSIS — R634 Abnormal weight loss: Secondary | ICD-10-CM | POA: Diagnosis not present

## 2021-10-20 DIAGNOSIS — K2970 Gastritis, unspecified, without bleeding: Secondary | ICD-10-CM | POA: Diagnosis not present

## 2021-10-20 DIAGNOSIS — K319 Disease of stomach and duodenum, unspecified: Secondary | ICD-10-CM | POA: Diagnosis not present

## 2021-10-20 DIAGNOSIS — R12 Heartburn: Secondary | ICD-10-CM | POA: Diagnosis not present

## 2021-10-26 DIAGNOSIS — K317 Polyp of stomach and duodenum: Secondary | ICD-10-CM | POA: Diagnosis not present

## 2021-10-26 DIAGNOSIS — K2970 Gastritis, unspecified, without bleeding: Secondary | ICD-10-CM | POA: Diagnosis not present

## 2021-12-06 DIAGNOSIS — E785 Hyperlipidemia, unspecified: Secondary | ICD-10-CM | POA: Diagnosis not present

## 2021-12-06 DIAGNOSIS — E1151 Type 2 diabetes mellitus with diabetic peripheral angiopathy without gangrene: Secondary | ICD-10-CM | POA: Diagnosis not present

## 2021-12-06 DIAGNOSIS — E039 Hypothyroidism, unspecified: Secondary | ICD-10-CM | POA: Diagnosis not present

## 2021-12-06 DIAGNOSIS — I10 Essential (primary) hypertension: Secondary | ICD-10-CM | POA: Diagnosis not present

## 2021-12-13 DIAGNOSIS — I739 Peripheral vascular disease, unspecified: Secondary | ICD-10-CM | POA: Diagnosis not present

## 2021-12-13 DIAGNOSIS — Z1339 Encounter for screening examination for other mental health and behavioral disorders: Secondary | ICD-10-CM | POA: Diagnosis not present

## 2021-12-13 DIAGNOSIS — R82998 Other abnormal findings in urine: Secondary | ICD-10-CM | POA: Diagnosis not present

## 2021-12-13 DIAGNOSIS — Z1331 Encounter for screening for depression: Secondary | ICD-10-CM | POA: Diagnosis not present

## 2021-12-13 DIAGNOSIS — E1151 Type 2 diabetes mellitus with diabetic peripheral angiopathy without gangrene: Secondary | ICD-10-CM | POA: Diagnosis not present

## 2021-12-13 DIAGNOSIS — E039 Hypothyroidism, unspecified: Secondary | ICD-10-CM | POA: Diagnosis not present

## 2021-12-13 DIAGNOSIS — F5104 Psychophysiologic insomnia: Secondary | ICD-10-CM | POA: Diagnosis not present

## 2021-12-13 DIAGNOSIS — D519 Vitamin B12 deficiency anemia, unspecified: Secondary | ICD-10-CM | POA: Diagnosis not present

## 2021-12-13 DIAGNOSIS — E785 Hyperlipidemia, unspecified: Secondary | ICD-10-CM | POA: Diagnosis not present

## 2021-12-13 DIAGNOSIS — R202 Paresthesia of skin: Secondary | ICD-10-CM | POA: Diagnosis not present

## 2021-12-13 DIAGNOSIS — I10 Essential (primary) hypertension: Secondary | ICD-10-CM | POA: Diagnosis not present

## 2021-12-26 DIAGNOSIS — D519 Vitamin B12 deficiency anemia, unspecified: Secondary | ICD-10-CM | POA: Diagnosis not present

## 2022-02-14 DIAGNOSIS — D649 Anemia, unspecified: Secondary | ICD-10-CM | POA: Diagnosis not present

## 2022-02-14 DIAGNOSIS — D519 Vitamin B12 deficiency anemia, unspecified: Secondary | ICD-10-CM | POA: Diagnosis not present

## 2022-02-14 DIAGNOSIS — K2970 Gastritis, unspecified, without bleeding: Secondary | ICD-10-CM | POA: Diagnosis not present

## 2022-02-14 DIAGNOSIS — Q6100 Congenital renal cyst, unspecified: Secondary | ICD-10-CM | POA: Diagnosis not present

## 2022-04-15 IMAGING — CT CT ABD-PELV W/O CM
2 of 3 series · 13 of 32 positions shown, 19 images · non-contrast
Comparison: None.

CLINICAL DATA: Anemia due to vitamin B12 deficiency.  Weight loss.

EXAM:
CT ABDOMEN AND PELVIS WITHOUT CONTRAST
TECHNIQUE: Multidetector CT imaging of the abdomen and pelvis was performed
following the standard protocol without IV contrast.

[Series 3: abd/pelvis w/(date) · axial · 0.72mm/px · z∈[+276,+666]mm · 10 of 96 slices shown, 16 images]
[im 9/96  soft-tissue]
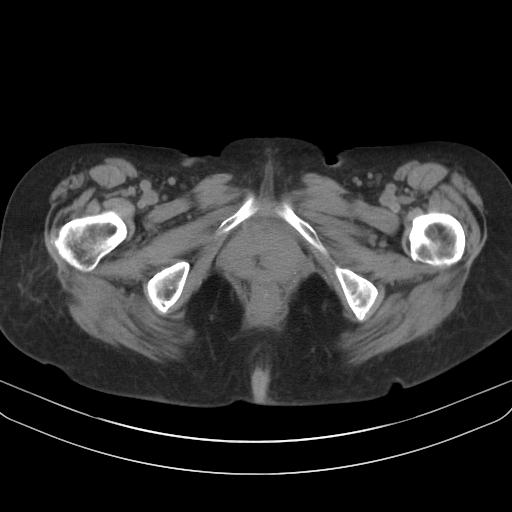
[im 9/96  bone]
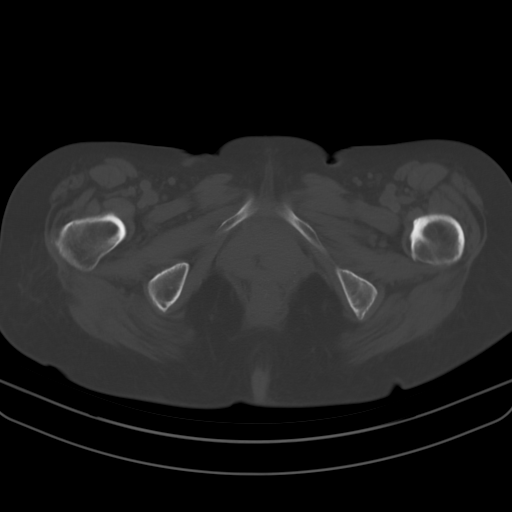
[im 18/96  soft-tissue]
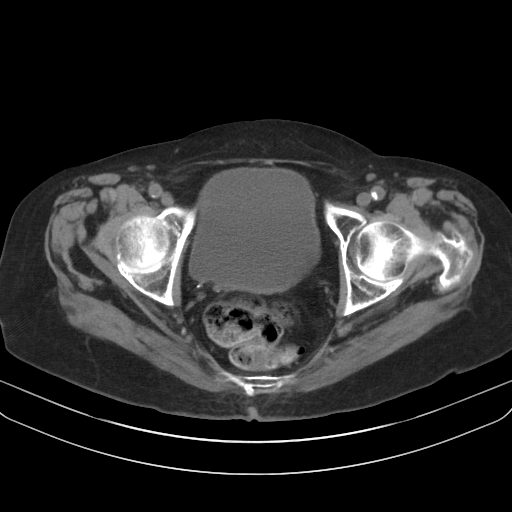
[im 26/96  soft-tissue]
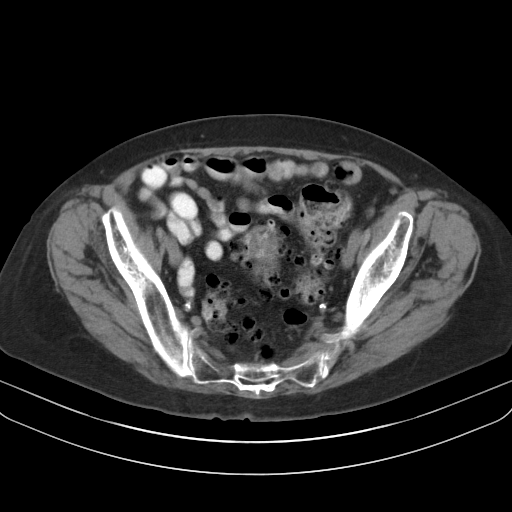
[im 35/96  soft-tissue]
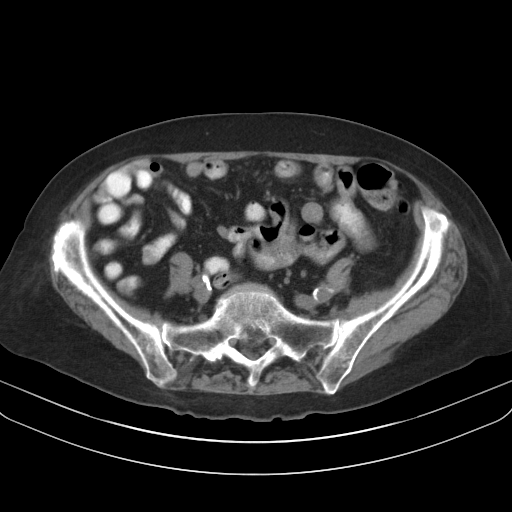
[im 44/96  soft-tissue]
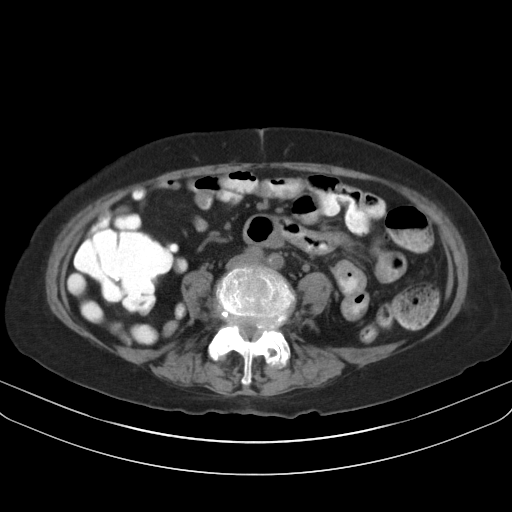
[im 52/96  soft-tissue]
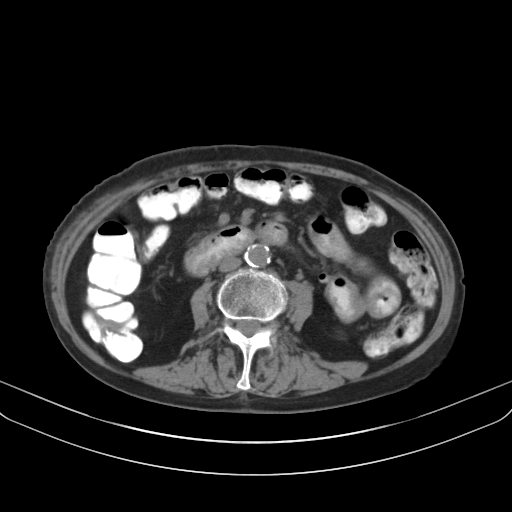
[im 61/96  soft-tissue]
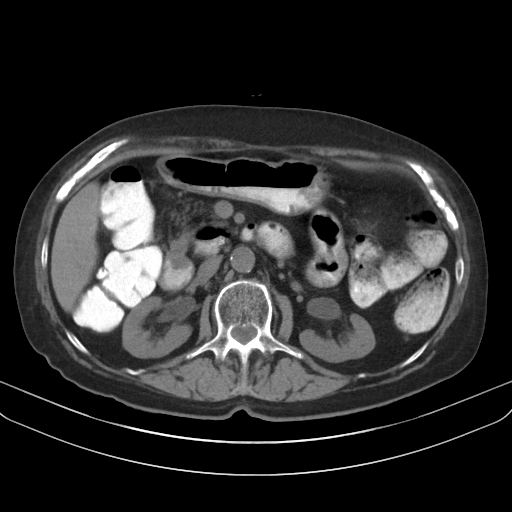
[im 61/96  lung]
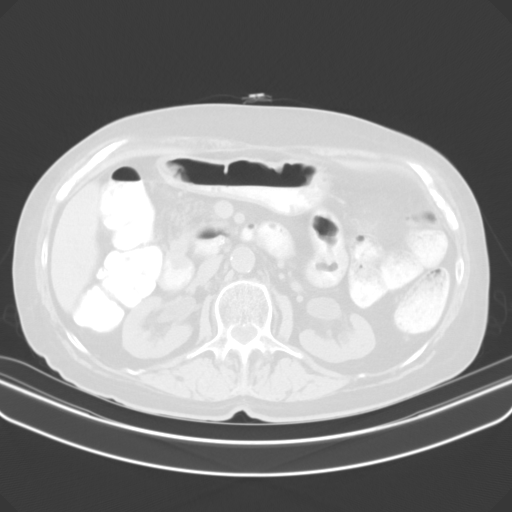
[im 70/96  soft-tissue]
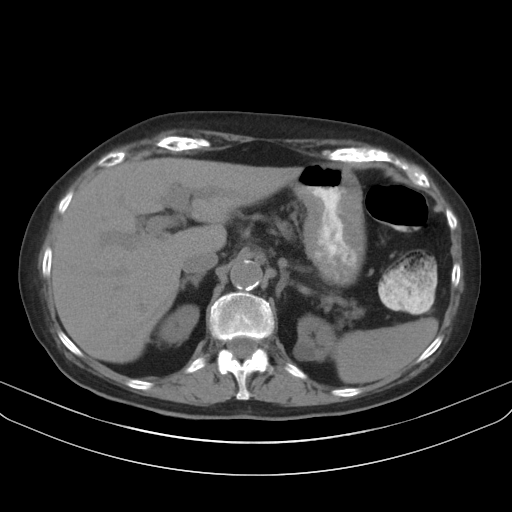
[im 70/96  lung]
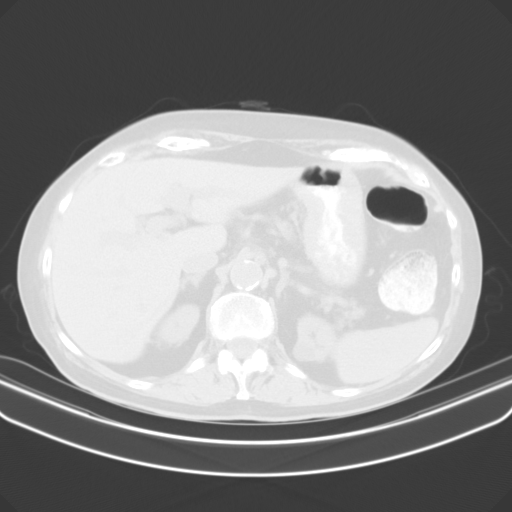
[im 78/96  soft-tissue]
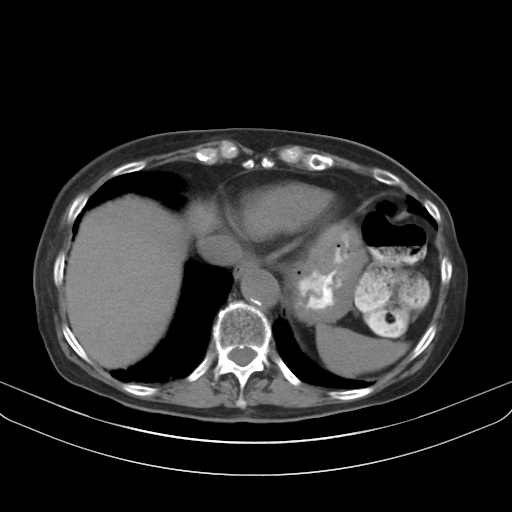
[im 78/96  lung]
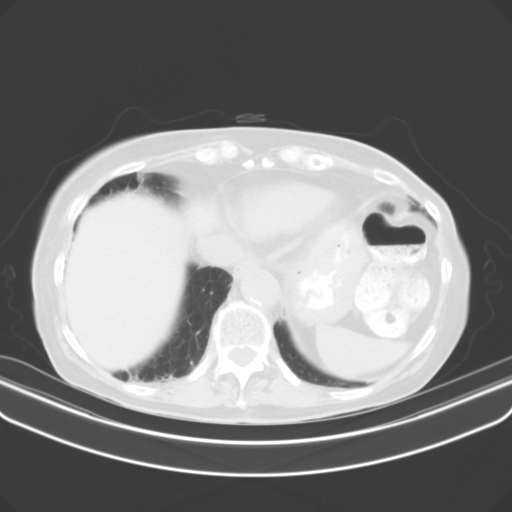
[im 78/96  bone]
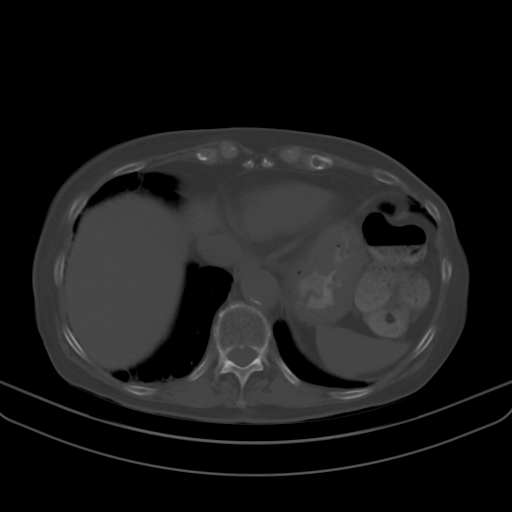
[im 87/96  soft-tissue]
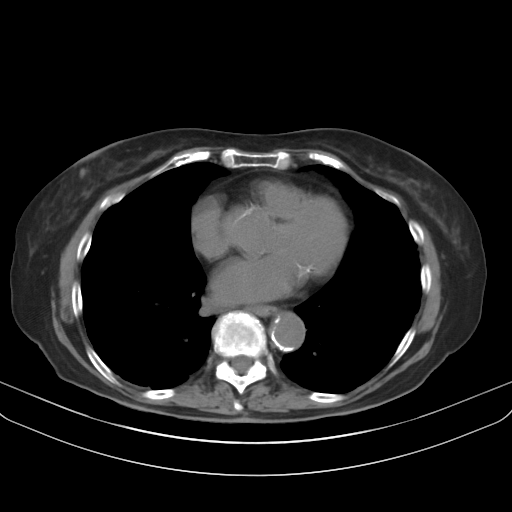
[im 87/96  lung]
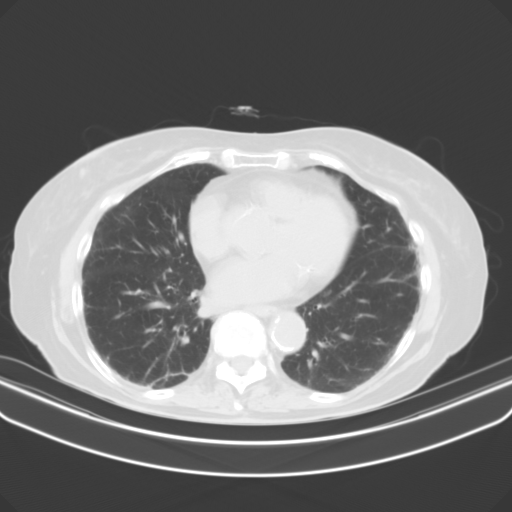

[Series 6: lung · axial · 0.64mm/px · z∈[+587,+623]mm · 3 of 71 slices shown]
[im 9/71  bone]
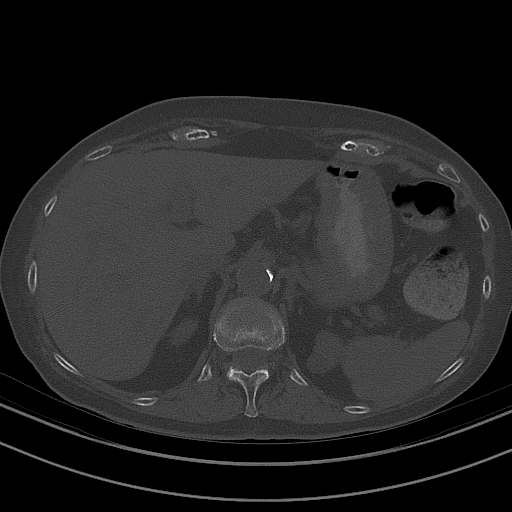
[im 18/71  bone]
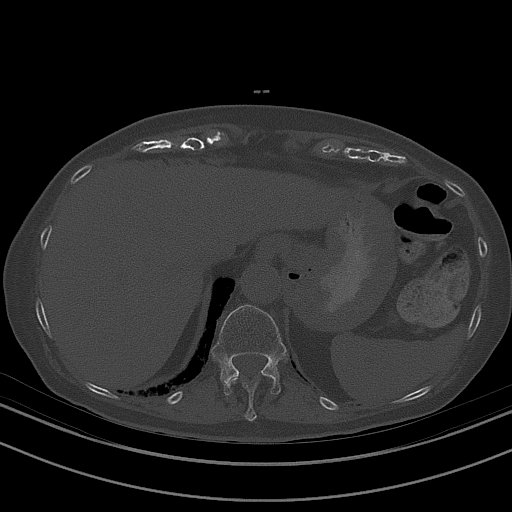
[im 27/71  bone]
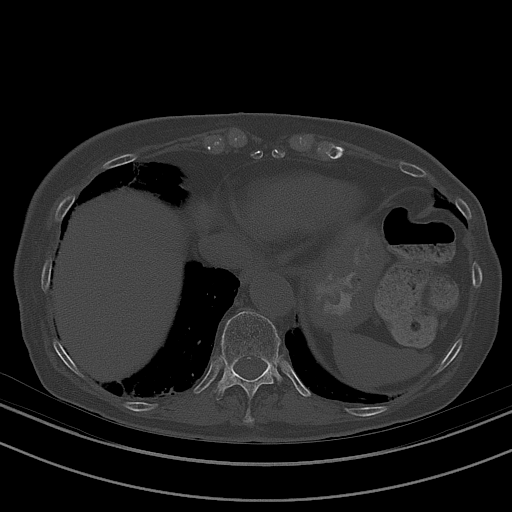

[13 of 32 positions shown; findings below may reference images not displayed]

FINDINGS: Lower chest: Scarring in the lung bases. No acute abnormality.
Coronary artery and aortic calcifications.

Hepatobiliary: No focal liver abnormality is seen. Status post
cholecystectomy. No biliary dilatation.

Pancreas: Fatty replacement. No focal abnormality or ductal
dilatation.

Spleen: No focal abnormality.  Normal size.

Adrenals/Urinary Tract: Small hypodensities in the upper poles of
both kidneys, likely small cysts measuring 12 mm on the left and 9
mm on the right. No renal or ureteral stones. No hydronephrosis.
Adrenal glands and urinary bladder unremarkable.

Stomach/Bowel: Extensive left colonic diverticulosis. No active
diverticulitis. Normal appendix. Stomach and small bowel
decompressed. No bowel obstruction.

Vascular/Lymphatic: Diffuse aortic atherosclerosis. No evidence of
aneurysm or adenopathy.

Reproductive: Prior hysterectomy.  No adnexal masses.

Other: No free fluid or free air.

Musculoskeletal: No acute bony abnormality.
IMPRESSION: Extensive left colonic diverticulosis.  No active diverticulitis.

Diffuse aortic atherosclerosis.

Small low-density lesions in the upper poles of both kidneys, likely
cysts although difficult to characterize on this noncontrast study.
This could be confirmed with renal ultrasound if felt clinically
indicated.

Bibasilar scarring.

No acute findings.

## 2022-05-23 DIAGNOSIS — K30 Functional dyspepsia: Secondary | ICD-10-CM | POA: Diagnosis not present

## 2022-05-23 DIAGNOSIS — F5104 Psychophysiologic insomnia: Secondary | ICD-10-CM | POA: Diagnosis not present

## 2022-05-23 DIAGNOSIS — I739 Peripheral vascular disease, unspecified: Secondary | ICD-10-CM | POA: Diagnosis not present

## 2022-05-23 DIAGNOSIS — H6123 Impacted cerumen, bilateral: Secondary | ICD-10-CM | POA: Diagnosis not present

## 2022-05-23 DIAGNOSIS — I10 Essential (primary) hypertension: Secondary | ICD-10-CM | POA: Diagnosis not present

## 2022-05-23 DIAGNOSIS — E785 Hyperlipidemia, unspecified: Secondary | ICD-10-CM | POA: Diagnosis not present

## 2022-05-23 DIAGNOSIS — E1151 Type 2 diabetes mellitus with diabetic peripheral angiopathy without gangrene: Secondary | ICD-10-CM | POA: Diagnosis not present

## 2022-05-23 DIAGNOSIS — E039 Hypothyroidism, unspecified: Secondary | ICD-10-CM | POA: Diagnosis not present

## 2022-06-04 IMAGING — US US ABDOMEN COMPLETE
1 series · 13 of 25 positions shown · non-contrast
Comparison: Abdomen and pelvis CT, dated August 22, 2021

CLINICAL DATA: Follow-up renal cysts. History of prior
cholecystectomy.

EXAM:
ABDOMEN ULTRASOUND COMPLETE

[Series 1: us abdomen complete · 0.19mm/px · 13 of 68 slices shown]
[im 1/68]
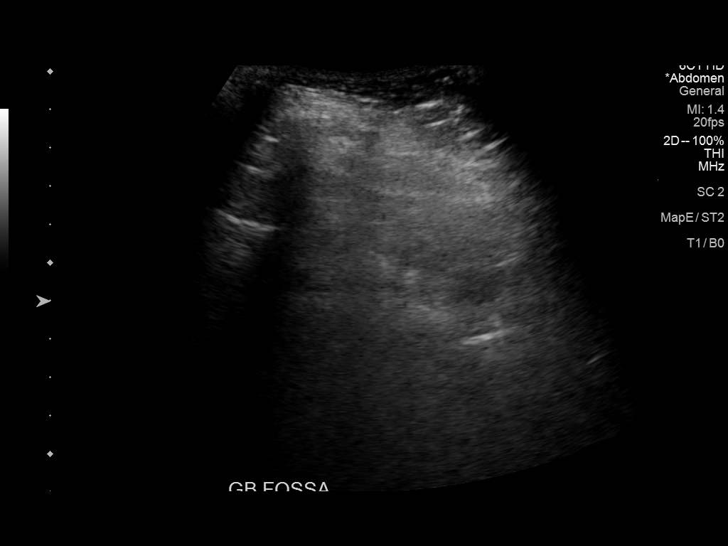
[im 6/68]
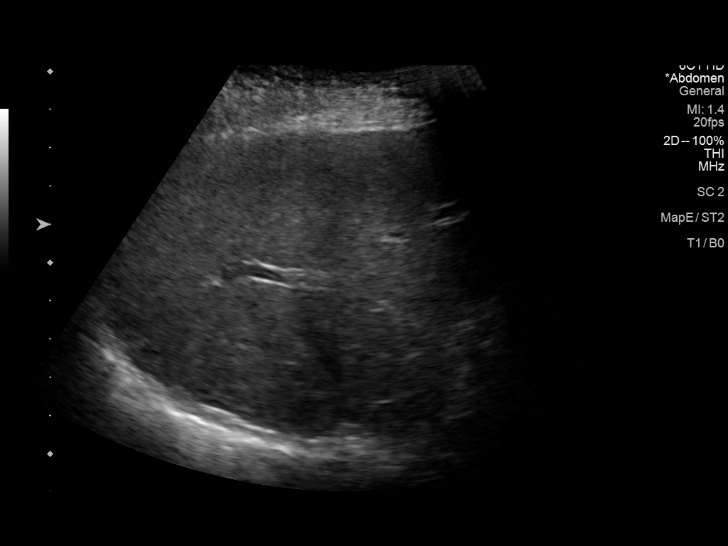
[im 12/68]
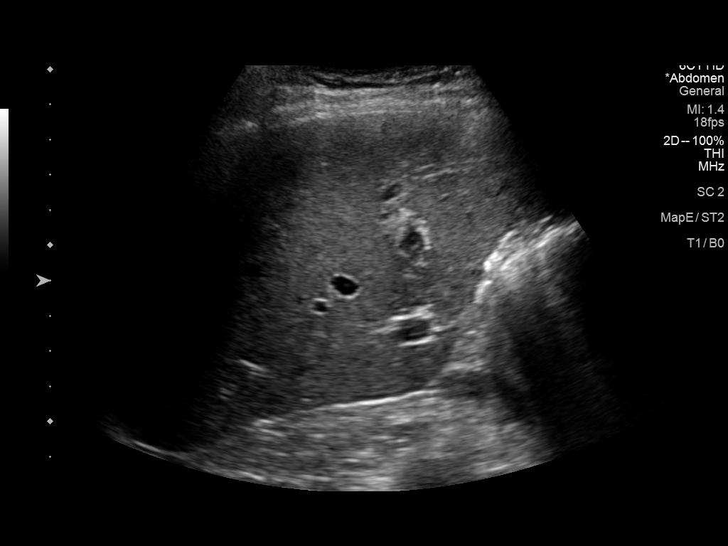
[im 17/68]
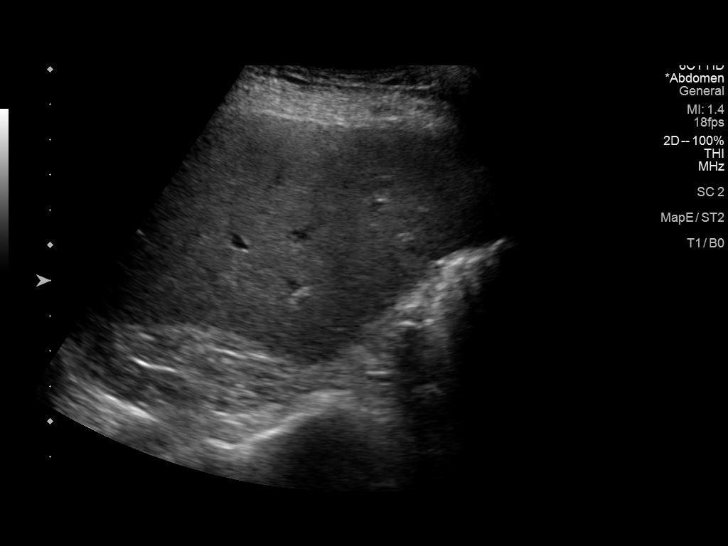
[im 23/68]
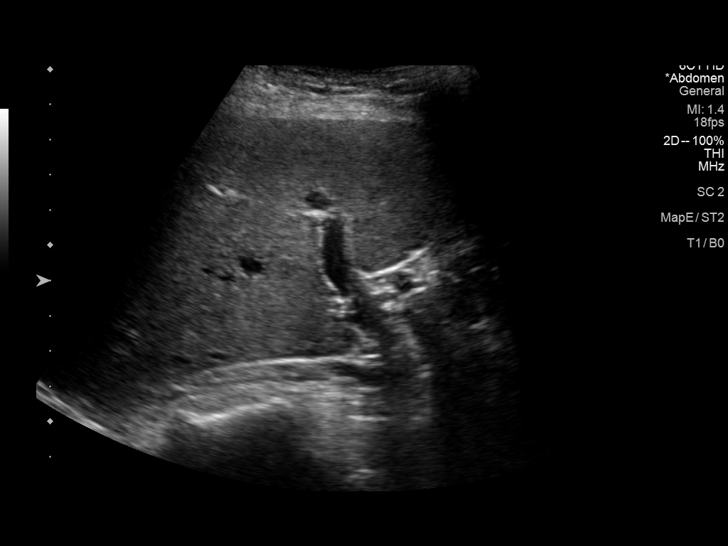
[im 28/68]
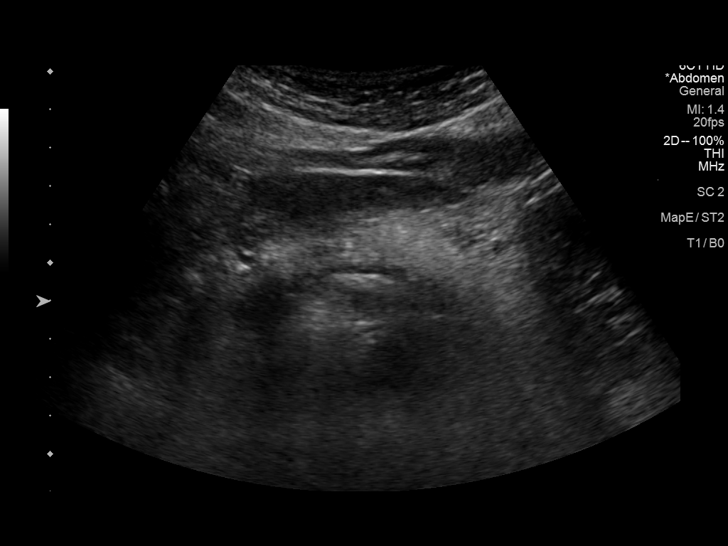
[im 34/68]
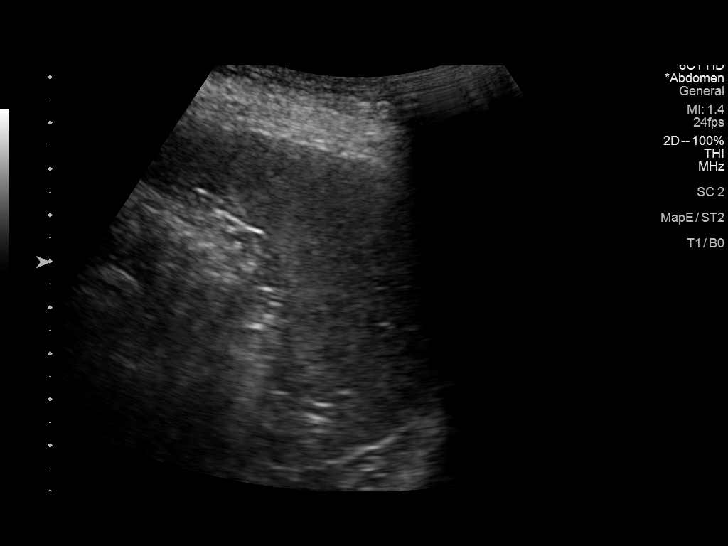
[im 40/68]
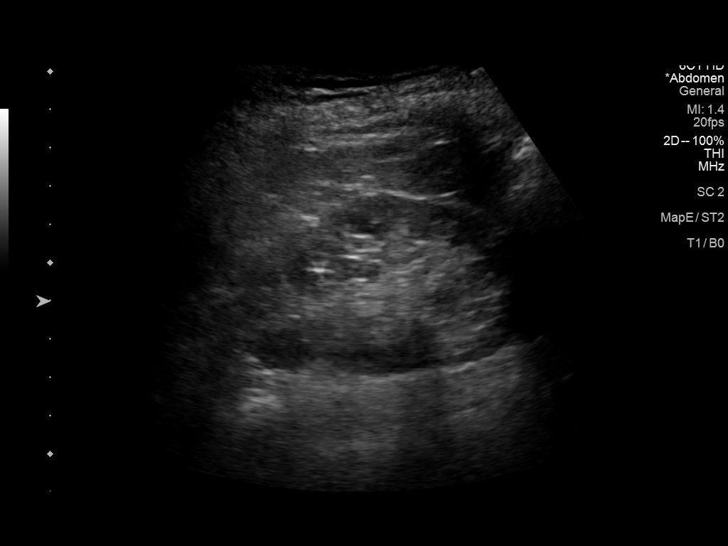
[im 45/68]
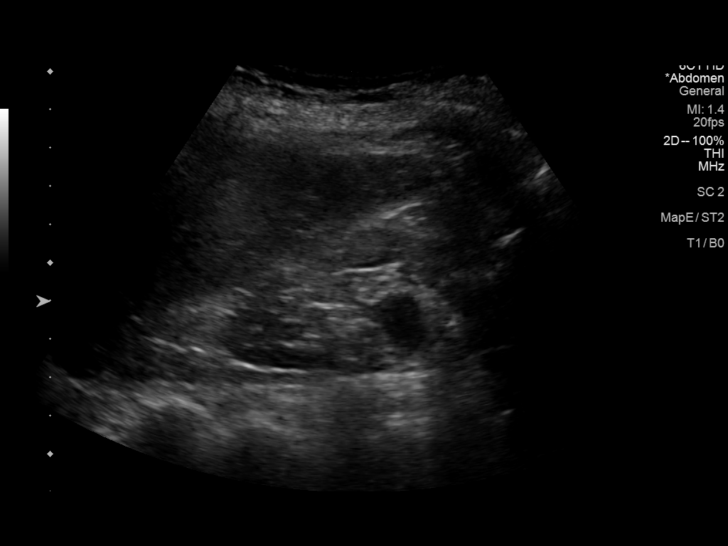
[im 51/68]
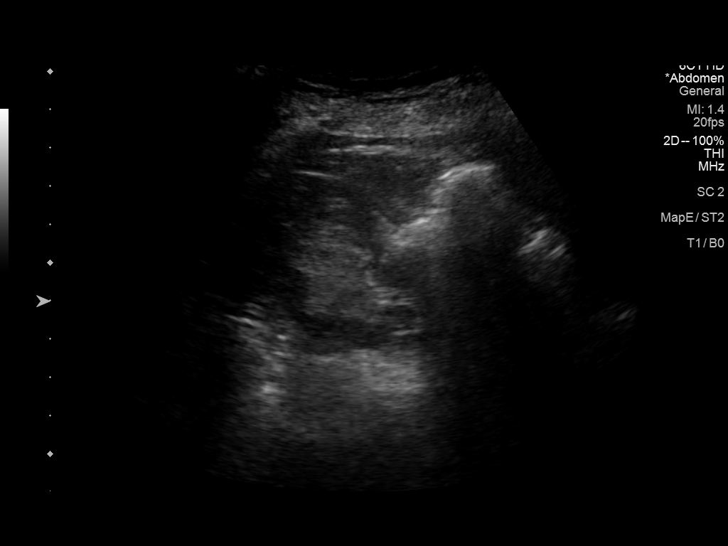
[im 56/68]
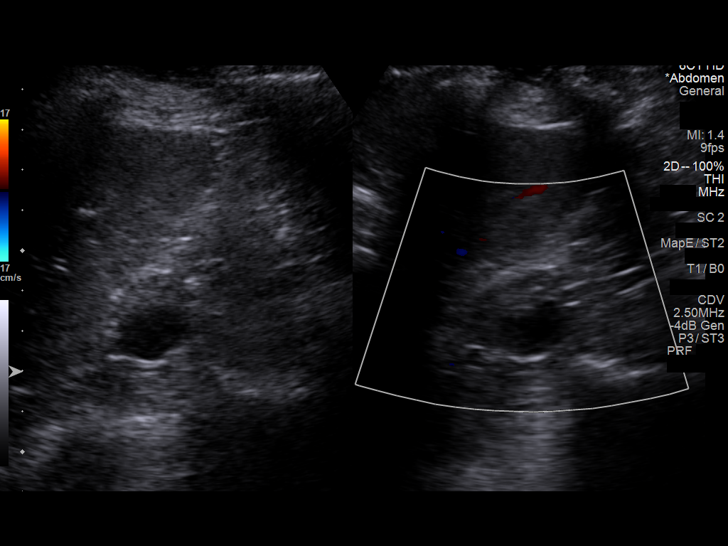
[im 62/68]
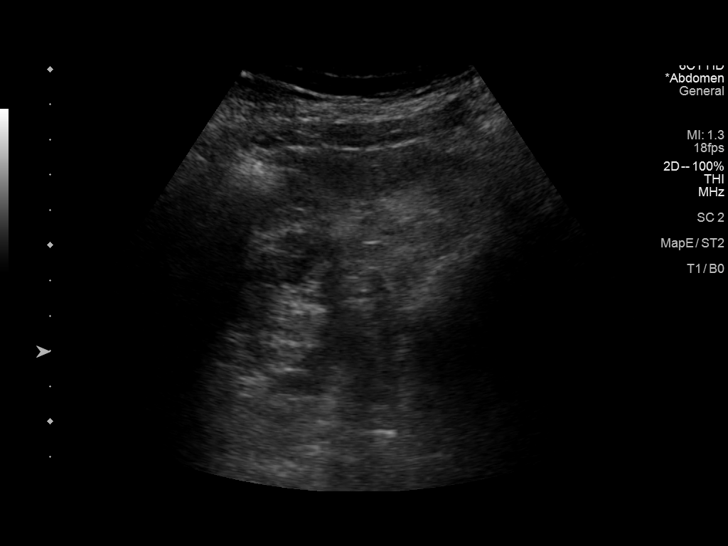
[im 68/68]
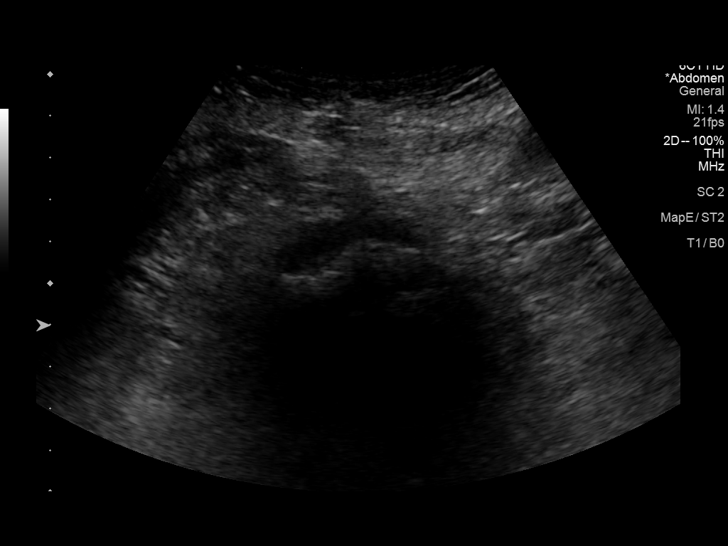

[13 of 25 positions shown; findings below may reference images not displayed]

FINDINGS: Gallbladder: The gallbladder is surgically absent.

Common bile duct: Diameter: 2.6 mm

Liver: No focal lesion identified. The liver parenchyma is mildly
inhomogeneous in appearance and within normal limits in
echogenicity. Portal vein is patent on color Doppler imaging with
normal direction of blood flow towards the liver.

IVC: Limited in evaluation secondary to overlying bowel gas.

Pancreas: Visualized portion unremarkable.

Spleen: Size (8.1 cm) and appearance within normal limits.

Right Kidney: Length: 9.0 cm. Echogenicity within normal limits. A
1.4 cm x 1.1 cm x 1.3 cm anechoic structure is seen within the right
kidney. An additional 1.5 cm x 1.6 cm x 1.2 cm anechoic structure is
noted adjacent to the expected region of the renal pelvis. No
abnormal flow is seen within these areas on color Doppler
evaluation.

Left Kidney: Length: 11.0 cm. Diffusely increased echogenicity of
the renal parenchyma is noted. A 1.7 cm x 1.5 cm x 2.1 cm anechoic
structure is seen within the left kidney. No abnormal flow is noted
within this region on color Doppler evaluation. No hydronephrosis is
visualized.

Abdominal aorta: No aneurysm visualized (2.7 cm in AP diameter).

Other findings: None.
IMPRESSION: 1. Increased echogenicity of the left kidney which is nonspecific in
nature and may represent sequelae associated with medical renal
disease.
2. Bilateral simple renal cysts.
3. Right-sided extrarenal pelvis which corresponds to findings seen
on the prior abdomen and pelvis CT, dated August 22, 2021.

## 2022-06-14 DIAGNOSIS — H35371 Puckering of macula, right eye: Secondary | ICD-10-CM | POA: Diagnosis not present

## 2022-06-14 DIAGNOSIS — H26491 Other secondary cataract, right eye: Secondary | ICD-10-CM | POA: Diagnosis not present

## 2022-06-14 DIAGNOSIS — H52223 Regular astigmatism, bilateral: Secondary | ICD-10-CM | POA: Diagnosis not present

## 2022-06-14 DIAGNOSIS — E119 Type 2 diabetes mellitus without complications: Secondary | ICD-10-CM | POA: Diagnosis not present

## 2022-06-14 DIAGNOSIS — Z961 Presence of intraocular lens: Secondary | ICD-10-CM | POA: Diagnosis not present

## 2022-06-14 DIAGNOSIS — H04123 Dry eye syndrome of bilateral lacrimal glands: Secondary | ICD-10-CM | POA: Diagnosis not present

## 2022-06-14 DIAGNOSIS — H43812 Vitreous degeneration, left eye: Secondary | ICD-10-CM | POA: Diagnosis not present

## 2022-07-10 DIAGNOSIS — H6123 Impacted cerumen, bilateral: Secondary | ICD-10-CM | POA: Diagnosis not present

## 2022-07-10 DIAGNOSIS — H9193 Unspecified hearing loss, bilateral: Secondary | ICD-10-CM | POA: Diagnosis not present

## 2022-08-22 DIAGNOSIS — H6122 Impacted cerumen, left ear: Secondary | ICD-10-CM | POA: Diagnosis not present

## 2022-08-22 DIAGNOSIS — H93293 Other abnormal auditory perceptions, bilateral: Secondary | ICD-10-CM | POA: Diagnosis not present

## 2022-09-19 DIAGNOSIS — Z1211 Encounter for screening for malignant neoplasm of colon: Secondary | ICD-10-CM | POA: Diagnosis not present

## 2022-09-19 DIAGNOSIS — R634 Abnormal weight loss: Secondary | ICD-10-CM | POA: Diagnosis not present

## 2022-09-19 DIAGNOSIS — K219 Gastro-esophageal reflux disease without esophagitis: Secondary | ICD-10-CM | POA: Diagnosis not present

## 2022-09-19 DIAGNOSIS — D519 Vitamin B12 deficiency anemia, unspecified: Secondary | ICD-10-CM | POA: Diagnosis not present

## 2023-01-29 DIAGNOSIS — E039 Hypothyroidism, unspecified: Secondary | ICD-10-CM | POA: Diagnosis not present

## 2023-01-29 DIAGNOSIS — E1151 Type 2 diabetes mellitus with diabetic peripheral angiopathy without gangrene: Secondary | ICD-10-CM | POA: Diagnosis not present

## 2023-01-29 DIAGNOSIS — E785 Hyperlipidemia, unspecified: Secondary | ICD-10-CM | POA: Diagnosis not present

## 2023-01-29 DIAGNOSIS — I10 Essential (primary) hypertension: Secondary | ICD-10-CM | POA: Diagnosis not present

## 2023-02-05 DIAGNOSIS — Z1331 Encounter for screening for depression: Secondary | ICD-10-CM | POA: Diagnosis not present

## 2023-02-05 DIAGNOSIS — E785 Hyperlipidemia, unspecified: Secondary | ICD-10-CM | POA: Diagnosis not present

## 2023-02-05 DIAGNOSIS — L989 Disorder of the skin and subcutaneous tissue, unspecified: Secondary | ICD-10-CM | POA: Diagnosis not present

## 2023-02-05 DIAGNOSIS — D649 Anemia, unspecified: Secondary | ICD-10-CM | POA: Diagnosis not present

## 2023-02-05 DIAGNOSIS — E1151 Type 2 diabetes mellitus with diabetic peripheral angiopathy without gangrene: Secondary | ICD-10-CM | POA: Diagnosis not present

## 2023-02-05 DIAGNOSIS — H9193 Unspecified hearing loss, bilateral: Secondary | ICD-10-CM | POA: Diagnosis not present

## 2023-02-05 DIAGNOSIS — E039 Hypothyroidism, unspecified: Secondary | ICD-10-CM | POA: Diagnosis not present

## 2023-02-05 DIAGNOSIS — R82998 Other abnormal findings in urine: Secondary | ICD-10-CM | POA: Diagnosis not present

## 2023-02-05 DIAGNOSIS — Z0000 Encounter for general adult medical examination without abnormal findings: Secondary | ICD-10-CM | POA: Diagnosis not present

## 2023-02-05 DIAGNOSIS — I739 Peripheral vascular disease, unspecified: Secondary | ICD-10-CM | POA: Diagnosis not present

## 2023-02-05 DIAGNOSIS — F5104 Psychophysiologic insomnia: Secondary | ICD-10-CM | POA: Diagnosis not present

## 2023-02-05 DIAGNOSIS — Z1339 Encounter for screening examination for other mental health and behavioral disorders: Secondary | ICD-10-CM | POA: Diagnosis not present

## 2023-02-16 DIAGNOSIS — E291 Testicular hypofunction: Secondary | ICD-10-CM | POA: Diagnosis not present

## 2023-02-16 DIAGNOSIS — D519 Vitamin B12 deficiency anemia, unspecified: Secondary | ICD-10-CM | POA: Diagnosis not present

## 2023-03-19 DIAGNOSIS — C44329 Squamous cell carcinoma of skin of other parts of face: Secondary | ICD-10-CM | POA: Diagnosis not present

## 2023-03-19 DIAGNOSIS — D492 Neoplasm of unspecified behavior of bone, soft tissue, and skin: Secondary | ICD-10-CM | POA: Diagnosis not present

## 2023-04-17 DIAGNOSIS — C44329 Squamous cell carcinoma of skin of other parts of face: Secondary | ICD-10-CM | POA: Diagnosis not present

## 2023-06-19 DIAGNOSIS — L821 Other seborrheic keratosis: Secondary | ICD-10-CM | POA: Diagnosis not present

## 2023-06-19 DIAGNOSIS — Z08 Encounter for follow-up examination after completed treatment for malignant neoplasm: Secondary | ICD-10-CM | POA: Diagnosis not present

## 2023-06-19 DIAGNOSIS — Z85828 Personal history of other malignant neoplasm of skin: Secondary | ICD-10-CM | POA: Diagnosis not present

## 2023-06-19 DIAGNOSIS — L814 Other melanin hyperpigmentation: Secondary | ICD-10-CM | POA: Diagnosis not present

## 2023-06-19 DIAGNOSIS — D225 Melanocytic nevi of trunk: Secondary | ICD-10-CM | POA: Diagnosis not present

## 2023-07-26 DIAGNOSIS — I739 Peripheral vascular disease, unspecified: Secondary | ICD-10-CM | POA: Diagnosis not present

## 2023-07-26 DIAGNOSIS — E1151 Type 2 diabetes mellitus with diabetic peripheral angiopathy without gangrene: Secondary | ICD-10-CM | POA: Diagnosis not present

## 2023-07-26 DIAGNOSIS — E039 Hypothyroidism, unspecified: Secondary | ICD-10-CM | POA: Diagnosis not present

## 2023-07-26 DIAGNOSIS — Z23 Encounter for immunization: Secondary | ICD-10-CM | POA: Diagnosis not present

## 2023-07-26 DIAGNOSIS — I10 Essential (primary) hypertension: Secondary | ICD-10-CM | POA: Diagnosis not present

## 2023-09-04 ENCOUNTER — Other Ambulatory Visit: Payer: Self-pay

## 2023-09-04 ENCOUNTER — Emergency Department (HOSPITAL_COMMUNITY): Payer: Medicare HMO

## 2023-09-04 ENCOUNTER — Encounter (HOSPITAL_COMMUNITY): Payer: Self-pay | Admitting: Emergency Medicine

## 2023-09-04 ENCOUNTER — Emergency Department (HOSPITAL_COMMUNITY)
Admission: EM | Admit: 2023-09-04 | Discharge: 2023-09-05 | Disposition: A | Discharge status: Home / Self Care | Payer: Medicare HMO | Attending: Emergency Medicine | Admitting: Emergency Medicine

## 2023-09-04 DIAGNOSIS — Z7984 Long term (current) use of oral hypoglycemic drugs: Secondary | ICD-10-CM | POA: Insufficient documentation

## 2023-09-04 DIAGNOSIS — I10 Essential (primary) hypertension: Secondary | ICD-10-CM | POA: Insufficient documentation

## 2023-09-04 DIAGNOSIS — R200 Anesthesia of skin: Secondary | ICD-10-CM | POA: Diagnosis not present

## 2023-09-04 DIAGNOSIS — M25552 Pain in left hip: Secondary | ICD-10-CM | POA: Diagnosis not present

## 2023-09-04 DIAGNOSIS — E119 Type 2 diabetes mellitus without complications: Secondary | ICD-10-CM | POA: Diagnosis not present

## 2023-09-04 DIAGNOSIS — Z79899 Other long term (current) drug therapy: Secondary | ICD-10-CM | POA: Diagnosis not present

## 2023-09-04 DIAGNOSIS — Z7982 Long term (current) use of aspirin: Secondary | ICD-10-CM | POA: Diagnosis not present

## 2023-09-04 DIAGNOSIS — M160 Bilateral primary osteoarthritis of hip: Secondary | ICD-10-CM | POA: Diagnosis not present

## 2023-09-04 DIAGNOSIS — I6782 Cerebral ischemia: Secondary | ICD-10-CM | POA: Diagnosis not present

## 2023-09-04 DIAGNOSIS — Z043 Encounter for examination and observation following other accident: Secondary | ICD-10-CM | POA: Diagnosis not present

## 2023-09-04 DIAGNOSIS — W19XXXA Unspecified fall, initial encounter: Secondary | ICD-10-CM

## 2023-09-04 DIAGNOSIS — R4781 Slurred speech: Secondary | ICD-10-CM | POA: Diagnosis not present

## 2023-09-04 DIAGNOSIS — R9089 Other abnormal findings on diagnostic imaging of central nervous system: Secondary | ICD-10-CM | POA: Diagnosis not present

## 2023-09-04 DIAGNOSIS — I771 Stricture of artery: Secondary | ICD-10-CM | POA: Diagnosis not present

## 2023-09-04 DIAGNOSIS — I700 Atherosclerosis of aorta: Secondary | ICD-10-CM | POA: Diagnosis not present

## 2023-09-04 DIAGNOSIS — M25551 Pain in right hip: Secondary | ICD-10-CM | POA: Diagnosis not present

## 2023-09-04 DIAGNOSIS — I6523 Occlusion and stenosis of bilateral carotid arteries: Secondary | ICD-10-CM | POA: Diagnosis not present

## 2023-09-04 DIAGNOSIS — R202 Paresthesia of skin: Secondary | ICD-10-CM | POA: Diagnosis not present

## 2023-09-04 LAB — CBC WITH DIFFERENTIAL/PLATELET
Abs Immature Granulocytes: 0.06 10*3/uL (ref 0.00–0.07)
Basophils Absolute: 0 10*3/uL (ref 0.0–0.1)
Basophils Relative: 1 %
Eosinophils Absolute: 0.1 10*3/uL (ref 0.0–0.5)
Eosinophils Relative: 1 %
HCT: 35.3 % — ABNORMAL LOW (ref 36.0–46.0)
Hemoglobin: 11.4 g/dL — ABNORMAL LOW (ref 12.0–15.0)
Immature Granulocytes: 1 %
Lymphocytes Relative: 31 %
Lymphs Abs: 2.4 10*3/uL (ref 0.7–4.0)
MCH: 31.8 pg (ref 26.0–34.0)
MCHC: 32.3 g/dL (ref 30.0–36.0)
MCV: 98.6 fL (ref 80.0–100.0)
Monocytes Absolute: 0.8 10*3/uL (ref 0.1–1.0)
Monocytes Relative: 10 %
Neutro Abs: 4.4 10*3/uL (ref 1.7–7.7)
Neutrophils Relative %: 56 %
Platelets: 218 10*3/uL (ref 150–400)
RBC: 3.58 MIL/uL — ABNORMAL LOW (ref 3.87–5.11)
RDW: 12.8 % (ref 11.5–15.5)
WBC: 7.7 10*3/uL (ref 4.0–10.5)
nRBC: 0 % (ref 0.0–0.2)

## 2023-09-04 LAB — BASIC METABOLIC PANEL
Anion gap: 10 (ref 5–15)
BUN: 23 mg/dL (ref 8–23)
CO2: 25 mmol/L (ref 22–32)
Calcium: 9.5 mg/dL (ref 8.9–10.3)
Chloride: 103 mmol/L (ref 98–111)
Creatinine, Ser: 1.47 mg/dL — ABNORMAL HIGH (ref 0.44–1.00)
GFR, Estimated: 34 mL/min — ABNORMAL LOW (ref >60)
Glucose, Bld: 229 mg/dL — ABNORMAL HIGH (ref 70–99)
Potassium: 4.1 mmol/L (ref 3.5–5.1)
Sodium: 138 mmol/L (ref 135–145)

## 2023-09-04 MED ORDER — SODIUM CHLORIDE 0.9 % IV SOLN: INTRAVENOUS | Status: DC

## 2023-09-04 MED ORDER — SODIUM CHLORIDE 0.9 % IV BOLUS
500.0000 mL | Freq: Once | INTRAVENOUS | Status: AC
  Administered 2023-09-04: 500 mL via INTRAVENOUS

## 2023-09-04 NOTE — ED Triage Notes (Addendum)
Family reported patient's right arm numbness at 7:30 pm last night , at 6:30 pm this evening she fell backwards at home / no LOC , with slurred speech and confusion . PA at triage evaluated patient at arrival . Alert and oriented , speech clear with no facial asymmetry , no arm drift at triage .

## 2023-09-04 NOTE — ED Provider Triage Note (Signed)
Emergency Medicine Provider Triage Evaluation Note  [name] , a 89 y.o. female  was evaluated in triage.  Pt complains of right upper extremity weakness yesterday around 1930 where she was unable to hold eating utensils. Today was found on the floor at home after a fall where she reportedly hit her head, with slight slurred speech and confusion. She is not on blood thinners. At present, she is awake, alert. Speech is clear. No facial ptosis. No arm or leg drift. Equal strength in all extremites.  Review of Systems  Positive: RUE weakness yesterday, momentary slurred speech and confusion, now resolved Negative: See above  Physical Exam  There were no vitals taken for this visit. Gen:   Awake, no distress   Resp:  Normal effort  MSK:   Moves extremities without difficulty  Other:  No obvious head trauma  Medical Decision Making  Medically screening exam initiated at 8:17 PM.  Appropriate orders placed.  Thelma Barge Boehme was informed that the remainder of the evaluation will be completed by another provider, this initial triage assessment does not replace that evaluation, and the importance of remaining in the ED until their evaluation is complete.     Felicie Morn, NP 09/04/23 2025

## 2023-09-04 NOTE — ED Provider Notes (Addendum)
[place] EMERGENCY DEPARTMENT AT [facility] Provider Note   CSN: [id number] Arrival date & time: 09/04/23  2004     History  Chief Complaint  Patient presents with   Slurred Speech     [name] is a 89 y.o. female.  Patient with a little bit of a complex story.  Patient had right arm numbness at 7:30 PM yesterday.  And then today at 6:30 PM had an unwitnessed fall I think she fell backwards down a flight of about 3 stairs.  Not clear whether there was loss of consciousness but patient certainly was confused with slurred speech when they got there.  It was not more than 20 minutes before she was seen.  The numbness in the right arm has resolved.  Patient's speech is now fine.  No obvious focal neurodeficits.  Past medical history significant for diabetes hyperlipidemia hypertension gastroesophageal reflux disease patient's had a hysterectomy and gallbladder removed.  Patient is not on any blood thinners.       Home Medications Prior to Admission medications   Medication Sig Start Date End Date Taking? Authorizing Provider  acetaminophen (TYLENOL) 500 MG tablet Take 1,000 mg by mouth every 6 (six) hours as needed for headache (pain).    [provider]  aspirin EC 81 MG EC tablet Take 1 tablet (81 mg total) by mouth daily. 03/31/14   Janetta Hora, PA-C  atorvastatin (LIPITOR) 20 MG tablet Take 1 tablet (20 mg total) by mouth daily at 6 PM. 03/31/14   Janetta Hora, PA-C  DimenhyDRINATE (DRAMAMINE PO) Take 1 tablet by mouth at bedtime as needed (inner ear).    [provider]  levothyroxine (SYNTHROID, LEVOTHROID) 50 MCG tablet Take 50 mcg by mouth daily before breakfast.    [provider]  metFORMIN (GLUCOPHAGE) 500 MG tablet Take 1 tablet (500 mg total) by mouth 2 (two) times daily with a meal. 04/02/14   Janetta Hora, PA-C  metoprolol tartrate (LOPRESSOR) 25 MG tablet TAKE 0.5 TABLETS (12.5 MG TOTAL) BY MOUTH 2 (TWO)  TIMES DAILY. 01/25/16   Lyn Records, MD  nitroGLYCERIN (NITROSTAT) 0.4 MG SL tablet Place 1 tablet (0.4 mg total) under the tongue every 5 (five) minutes x 3 doses as needed for chest pain. 03/31/14   Janetta Hora, PA-C  pantoprazole (PROTONIX) 40 MG tablet Take 1 tablet (40 mg total) by mouth daily. 03/31/14   Janetta Hora, PA-C      Allergies    Codeine    Review of Systems   Review of Systems  Constitutional:  Negative for chills and fever.  HENT:  Negative for ear pain and sore throat.   Eyes:  Negative for pain and visual disturbance.  Respiratory:  Negative for cough and shortness of breath.   Cardiovascular:  Negative for chest pain and palpitations.  Gastrointestinal:  Negative for abdominal pain and vomiting.  Genitourinary:  Negative for dysuria and hematuria.  Musculoskeletal:  Negative for arthralgias and back pain.  Skin:  Negative for color change and rash.  Neurological:  Positive for syncope and numbness. Negative for dizziness, seizures, weakness and headaches.  All other systems reviewed and are negative.   Physical Exam Updated Vital Signs BP 138/87   Pulse 86   Resp 16   SpO2 99%  Physical Exam Vitals and nursing note reviewed.  Constitutional:      General: She is not in acute distress.    Appearance: Normal appearance. She  is well-developed.  HENT:     Head: Normocephalic and atraumatic.  Eyes:     Conjunctiva/sclera: Conjunctivae normal.     Pupils: Pupils are equal, round, and reactive to light.  Cardiovascular:     Rate and Rhythm: Normal rate and regular rhythm.     Heart sounds: No murmur heard. Pulmonary:     Effort: Pulmonary effort is normal. No respiratory distress.     Breath sounds: Normal breath sounds.  Abdominal:     General: There is no distension.     Palpations: Abdomen is soft.     Tenderness: There is no abdominal tenderness. There is no guarding.  Musculoskeletal:        General: No swelling.     Cervical back:  Normal range of motion and neck supple. No rigidity or tenderness.     Right lower leg: No edema.     Left lower leg: No edema.  Skin:    General: Skin is warm and dry.     Capillary Refill: Capillary refill takes less than 2 seconds.  Neurological:     General: No focal deficit present.     Mental Status: She is alert and oriented to person, place, and time.     Cranial Nerves: No cranial nerve deficit.     Sensory: No sensory deficit.     Motor: No weakness.     Coordination: Coordination normal.  Psychiatric:        Mood and Affect: Mood normal.     ED Results / Procedures / Treatments   Labs (all labs ordered are listed, but only abnormal results are displayed) Labs Reviewed  BASIC METABOLIC PANEL - Abnormal; Notable for the following components:      Result Value   Glucose, Bld 229 (*)    Creatinine, Ser 1.47 (*)    GFR, Estimated 34 (*)    All other components within normal limits  CBC WITH DIFFERENTIAL/PLATELET - Abnormal; Notable for the following components:   RBC 3.58 (*)    Hemoglobin 11.4 (*)    HCT 35.3 (*)    All other components within normal limits  URINALYSIS, ROUTINE W REFLEX MICROSCOPIC    EKG EKG Interpretation Date/Time:  Tuesday September 04 2023 21:17:58 EST Ventricular Rate:  84 PR Interval:  176 QRS Duration:  87 QT Interval:  367 QTC Calculation: 434 R Axis:   -42  Text Interpretation: Sinus rhythm Left anterior fascicular block Abnormal R-wave progression, early transition Left ventricular hypertrophy No previous ECGs available Confirmed by Vanetta Mulders [phone]) on 09/04/2023 9:29:37 PM  Radiology CT Head Wo Contrast Result Date: 09/04/2023 CLINICAL DATA:  Transient ischemic attack EXAM: CT HEAD WITHOUT CONTRAST TECHNIQUE: Contiguous axial images were obtained from the base of the skull through the vertex without intravenous contrast. RADIATION DOSE REDUCTION: This exam was performed according to the departmental dose-optimization  program which includes automated exposure control, adjustment of the mA and/or kV according to patient size and/or use of iterative reconstruction technique. COMPARISON:  None Available. FINDINGS: Brain: There is no mass, hemorrhage or extra-axial collection. Normal appearance of the white matter with preserved gray-white differentiation. Normal CSF spaces. Vascular: There is atherosclerotic calcification of both internal carotid arteries at the skull base. Skull: Negative Sinuses/Orbits: Paranasal sinuses are clear. No mastoid effusion. Normal orbits. Other: None IMPRESSION: No acute intracranial abnormality. Electronically Signed   By: Deatra Robinson M.D.   On: 09/04/2023 21:16    Procedures Procedures    Medications Ordered in ED  Medications  sodium chloride 0.9 % bolus 500 mL (has no administration in time range)  0.9 %  sodium chloride infusion (has no administration in time range)    ED Course/ Medical Decision Making/ A&P                                 Medical Decision Making Amount and/or Complexity of Data Reviewed Radiology: ordered.  Risk Prescription drug management.   Patient could had a small stroke event last evening.  Will get MRI brain for that head CT without any acute findings.  No traumatic event from the falling backwards and perhaps hitting her head.  Patient is neuroexam currently is very good.  Will get x-rays of both hips and get a chest x-ray.  Patient's basic metabolic panel shows a GFR of 34 with a creatinine 1.47 which is higher than baseline for her.  Based on this we will give her some fluids.  CBC white count 7.7 hemoglobin 11.4 platelets 218.  Patient's EKG no old 1 for comparison shows sinus rhythm with a left anterior fascicular block.  Patient denies any chest pain.  Patient denies any respiratory symptoms  MRI brain urinalysis chest x-ray and x-rays of both hips and pelvis.  MRI brain still pending.  Chest x-ray x-rays of both hips and pelvis  without any significant abnormalities.  Disposition will be based on MRI.   Final Clinical Impression(s) / ED Diagnoses Final diagnoses:  Fall, initial encounter  Right arm numbness    Rx / DC Orders ED Discharge Orders     None         Vanetta Mulders, MD 09/04/23 2150    Vanetta Mulders, MD 09/04/23 [phone]

## 2023-09-05 ENCOUNTER — Emergency Department (HOSPITAL_COMMUNITY): Payer: Medicare HMO

## 2023-09-05 DIAGNOSIS — I6782 Cerebral ischemia: Secondary | ICD-10-CM | POA: Diagnosis not present

## 2023-09-05 DIAGNOSIS — R200 Anesthesia of skin: Secondary | ICD-10-CM | POA: Diagnosis not present

## 2023-09-05 DIAGNOSIS — R9089 Other abnormal findings on diagnostic imaging of central nervous system: Secondary | ICD-10-CM | POA: Diagnosis not present

## 2023-09-05 NOTE — ED Notes (Signed)
Pt able to ambulate to bathroom with stby assist

## 2023-09-05 NOTE — Discharge Instructions (Signed)

## 2023-09-05 NOTE — ED Provider Notes (Signed)
I assumed care at signout to follow-up on imaging.  Concern for possible TIA as patient had numbness, confusion and slurred speech Patient is now back to baseline.  MRI negative.  Patient is ambulatory.  On my exam patient is awake alert, she is watching TV and interactive.  No facial droop.  No arm or leg weakness Patient has been walking around the ER without any difficulty. Patient would like to be discharged home.  Family at bedside is comfortable with plan. Will place an ambulatory referral to neurology due to unexplained cause of her symptoms Low suspicion for occult CVA/TIA at this time   Zadie Rhine, MD 09/05/23 [phone]

## 2023-12-20 DIAGNOSIS — L821 Other seborrheic keratosis: Secondary | ICD-10-CM | POA: Diagnosis not present

## 2023-12-20 DIAGNOSIS — Z85828 Personal history of other malignant neoplasm of skin: Secondary | ICD-10-CM | POA: Diagnosis not present

## 2023-12-20 DIAGNOSIS — Z08 Encounter for follow-up examination after completed treatment for malignant neoplasm: Secondary | ICD-10-CM | POA: Diagnosis not present

## 2023-12-20 DIAGNOSIS — D225 Melanocytic nevi of trunk: Secondary | ICD-10-CM | POA: Diagnosis not present

## 2023-12-20 DIAGNOSIS — L814 Other melanin hyperpigmentation: Secondary | ICD-10-CM | POA: Diagnosis not present

## 2023-12-20 DIAGNOSIS — L570 Actinic keratosis: Secondary | ICD-10-CM | POA: Diagnosis not present

## 2024-01-10 DIAGNOSIS — Z135 Encounter for screening for eye and ear disorders: Secondary | ICD-10-CM | POA: Diagnosis not present

## 2024-01-10 DIAGNOSIS — H04123 Dry eye syndrome of bilateral lacrimal glands: Secondary | ICD-10-CM | POA: Diagnosis not present

## 2024-01-10 DIAGNOSIS — H26491 Other secondary cataract, right eye: Secondary | ICD-10-CM | POA: Diagnosis not present

## 2024-01-10 DIAGNOSIS — H52223 Regular astigmatism, bilateral: Secondary | ICD-10-CM | POA: Diagnosis not present

## 2024-01-10 DIAGNOSIS — E119 Type 2 diabetes mellitus without complications: Secondary | ICD-10-CM | POA: Diagnosis not present

## 2024-01-10 DIAGNOSIS — Z961 Presence of intraocular lens: Secondary | ICD-10-CM | POA: Diagnosis not present

## 2024-01-10 DIAGNOSIS — H43812 Vitreous degeneration, left eye: Secondary | ICD-10-CM | POA: Diagnosis not present

## 2024-01-10 DIAGNOSIS — H35371 Puckering of macula, right eye: Secondary | ICD-10-CM | POA: Diagnosis not present

## 2024-01-31 DIAGNOSIS — E785 Hyperlipidemia, unspecified: Secondary | ICD-10-CM | POA: Diagnosis not present

## 2024-01-31 DIAGNOSIS — E1151 Type 2 diabetes mellitus with diabetic peripheral angiopathy without gangrene: Secondary | ICD-10-CM | POA: Diagnosis not present

## 2024-01-31 DIAGNOSIS — I10 Essential (primary) hypertension: Secondary | ICD-10-CM | POA: Diagnosis not present

## 2024-01-31 DIAGNOSIS — E039 Hypothyroidism, unspecified: Secondary | ICD-10-CM | POA: Diagnosis not present

## 2024-01-31 DIAGNOSIS — D649 Anemia, unspecified: Secondary | ICD-10-CM | POA: Diagnosis not present

## 2024-02-07 DIAGNOSIS — I10 Essential (primary) hypertension: Secondary | ICD-10-CM | POA: Diagnosis not present

## 2024-02-07 DIAGNOSIS — E785 Hyperlipidemia, unspecified: Secondary | ICD-10-CM | POA: Diagnosis not present

## 2024-02-07 DIAGNOSIS — R82998 Other abnormal findings in urine: Secondary | ICD-10-CM | POA: Diagnosis not present

## 2024-02-07 DIAGNOSIS — E1151 Type 2 diabetes mellitus with diabetic peripheral angiopathy without gangrene: Secondary | ICD-10-CM | POA: Diagnosis not present

## 2024-02-07 DIAGNOSIS — R300 Dysuria: Secondary | ICD-10-CM | POA: Diagnosis not present

## 2024-02-07 DIAGNOSIS — I739 Peripheral vascular disease, unspecified: Secondary | ICD-10-CM | POA: Diagnosis not present

## 2024-02-07 DIAGNOSIS — Z1331 Encounter for screening for depression: Secondary | ICD-10-CM | POA: Diagnosis not present

## 2024-02-07 DIAGNOSIS — H6123 Impacted cerumen, bilateral: Secondary | ICD-10-CM | POA: Diagnosis not present

## 2024-02-07 DIAGNOSIS — Z0000 Encounter for general adult medical examination without abnormal findings: Secondary | ICD-10-CM | POA: Diagnosis not present

## 2024-02-07 DIAGNOSIS — Z1339 Encounter for screening examination for other mental health and behavioral disorders: Secondary | ICD-10-CM | POA: Diagnosis not present

## 2024-02-07 DIAGNOSIS — E039 Hypothyroidism, unspecified: Secondary | ICD-10-CM | POA: Diagnosis not present

## 2024-02-14 DIAGNOSIS — H6123 Impacted cerumen, bilateral: Secondary | ICD-10-CM | POA: Diagnosis not present

## 2024-02-14 DIAGNOSIS — H9193 Unspecified hearing loss, bilateral: Secondary | ICD-10-CM | POA: Diagnosis not present

## 2024-04-06 ENCOUNTER — Emergency Department (HOSPITAL_BASED_OUTPATIENT_CLINIC_OR_DEPARTMENT_OTHER)

## 2024-04-06 ENCOUNTER — Emergency Department (HOSPITAL_BASED_OUTPATIENT_CLINIC_OR_DEPARTMENT_OTHER)
Admission: EM | Admit: 2024-04-06 | Discharge: 2024-04-06 | Disposition: A | Discharge status: Home / Self Care | Attending: Emergency Medicine | Admitting: Emergency Medicine

## 2024-04-06 ENCOUNTER — Encounter (HOSPITAL_BASED_OUTPATIENT_CLINIC_OR_DEPARTMENT_OTHER): Payer: Self-pay | Admitting: Emergency Medicine

## 2024-04-06 DIAGNOSIS — Z7984 Long term (current) use of oral hypoglycemic drugs: Secondary | ICD-10-CM | POA: Insufficient documentation

## 2024-04-06 DIAGNOSIS — I10 Essential (primary) hypertension: Secondary | ICD-10-CM | POA: Diagnosis not present

## 2024-04-06 DIAGNOSIS — Z79899 Other long term (current) drug therapy: Secondary | ICD-10-CM | POA: Insufficient documentation

## 2024-04-06 DIAGNOSIS — M25522 Pain in left elbow: Secondary | ICD-10-CM | POA: Diagnosis not present

## 2024-04-06 DIAGNOSIS — E119 Type 2 diabetes mellitus without complications: Secondary | ICD-10-CM | POA: Diagnosis not present

## 2024-04-06 DIAGNOSIS — E039 Hypothyroidism, unspecified: Secondary | ICD-10-CM | POA: Insufficient documentation

## 2024-04-06 DIAGNOSIS — S4992XA Unspecified injury of left shoulder and upper arm, initial encounter: Secondary | ICD-10-CM | POA: Diagnosis present

## 2024-04-06 DIAGNOSIS — S42412A Displaced simple supracondylar fracture without intercondylar fracture of left humerus, initial encounter for closed fracture: Secondary | ICD-10-CM | POA: Diagnosis not present

## 2024-04-06 DIAGNOSIS — Z7982 Long term (current) use of aspirin: Secondary | ICD-10-CM | POA: Insufficient documentation

## 2024-04-06 DIAGNOSIS — W010XXA Fall on same level from slipping, tripping and stumbling without subsequent striking against object, initial encounter: Secondary | ICD-10-CM | POA: Insufficient documentation

## 2024-04-06 DIAGNOSIS — S42422A Displaced comminuted supracondylar fracture without intercondylar fracture of left humerus, initial encounter for closed fracture: Secondary | ICD-10-CM | POA: Diagnosis not present

## 2024-04-06 MED ORDER — OXYCODONE-ACETAMINOPHEN 5-325 MG PO TABS: 1.0000 | ORAL_TABLET | Freq: Four times a day (QID) | ORAL | 0 refills | Status: DC | PRN

## 2024-04-06 MED ORDER — OXYCODONE-ACETAMINOPHEN 5-325 MG PO TABS: 1.0000 | ORAL_TABLET | Freq: Four times a day (QID) | ORAL | 0 refills | Status: AC | PRN

## 2024-04-06 MED ORDER — OXYCODONE-ACETAMINOPHEN 5-325 MG PO TABS
1.0000 | ORAL_TABLET | Freq: Once | ORAL | Status: AC
  Administered 2024-04-06: 1 via ORAL
  Filled 2024-04-06: qty 1

## 2024-04-06 MED ORDER — FENTANYL CITRATE PF 50 MCG/ML IJ SOSY
50.0000 ug | PREFILLED_SYRINGE | Freq: Once | INTRAMUSCULAR | Status: AC
  Administered 2024-04-06: 50 ug via INTRAVENOUS
  Filled 2024-04-06: qty 1

## 2024-04-06 NOTE — ED Provider Notes (Signed)
 [place] [facility] Provider Note   CSN: [id number] Arrival date & time: 04/06/24  8397     Patient presents with: [name] is a 89 y.o. female.   89 year old female presenting after a fall.  At the time of my examination, the patient is already in a sling of her left upper extremity that was placed here.  She reports tripping over a box in the floor and landing on her left arm, notes pain to her left elbow.  She is right-hand dominant.  She has a Band-Aid in place over her right elbow for a skin tear from the fall.  Denies head injury/loss of consciousness.  She lives at home with her daughter and son-in-law and husband.  Not on blood thinners.   Fall       Prior to Admission medications   Medication Sig Start Date End Date Taking? Authorizing Provider  acetaminophen  (TYLENOL ) 500 MG tablet Take 1,000 mg by mouth every 6 (six) hours as needed for headache (pain).    [provider]  aspirin  EC 81 MG EC tablet Take 1 tablet (81 mg total) by mouth daily. 03/31/14   Sebastian Lamarr SAUNDERS, PA-C  atorvastatin  (LIPITOR) 20 MG tablet Take 1 tablet (20 mg total) by mouth daily at 6 PM. 03/31/14   Thompson, Kathryn R, PA-C  DimenhyDRINATE (DRAMAMINE PO) Take 1 tablet by mouth at bedtime as needed (inner ear).    [provider]  levothyroxine (SYNTHROID, LEVOTHROID) 50 MCG tablet Take 50 mcg by mouth daily before breakfast.    [provider]  metFORMIN  (GLUCOPHAGE ) 500 MG tablet Take 1 tablet (500 mg total) by mouth 2 (two) times daily with a meal. 04/02/14   Sebastian Lamarr SAUNDERS, PA-C  metoprolol  tartrate (LOPRESSOR ) 25 MG tablet TAKE 0.5 TABLETS (12.5 MG TOTAL) BY MOUTH 2 (TWO) TIMES DAILY. 01/25/16   Claudene Victory ORN, MD  nitroGLYCERIN  (NITROSTAT ) 0.4 MG SL tablet Place 1 tablet (0.4 mg total) under the tongue every 5 (five) minutes x 3 doses as needed for chest pain. 03/31/14   Sebastian Lamarr SAUNDERS, PA-C  pantoprazole   (PROTONIX ) 40 MG tablet Take 1 tablet (40 mg total) by mouth daily. 03/31/14   Sebastian Lamarr SAUNDERS, PA-C    Allergies: Codeine    Review of Systems  Updated Vital Signs BP (!) 147/77 (BP Location: Right Arm)   Pulse 88   Temp 97.6 F (36.4 C) (Oral)   Resp 20   SpO2 97%   Physical Exam Vitals and nursing note reviewed.  HENT:     Head: Normocephalic.  Eyes:     Extraocular Movements: Extraocular movements intact.  Cardiovascular:     Rate and Rhythm: Normal rate.  Pulmonary:     Effort: Pulmonary effort is normal.  Musculoskeletal:     Cervical back: Normal range of motion.     Comments: Left upper extremity is in a sling. No tenderness to palpation of the left shoulder/scapula, unable to assess range of motion secondary to placement of sling. No tenderness to palpation of the left wrist, full range of motion at the wrist. Grip strength intact but limited secondary to pain. Elbow palpated over sling, this elicits pain, unable to visualize the elbow directly secondary to sling.   Skin:    General: Skin is warm and dry.  Neurological:     Mental Status: She is alert and oriented to person, place, and time.     (all labs  ordered are listed, but only abnormal results are displayed) Labs Reviewed - No data to display  EKG: None  Radiology: DG Elbow Complete Left Result Date: 04/06/2024 CLINICAL DATA:  Status post fall. EXAM: LEFT ELBOW - COMPLETE 3+ VIEW COMPARISON:  None Available. FINDINGS: An acute fracture deformity is seen extending through the supracondylar region of the distal left humerus. There is no evidence of dislocation. A small posterior fat pad is also present along the distal left humerus. Diffuse soft tissue swelling is noted. IMPRESSION: Acute supracondylar fracture of the distal left humerus. Electronically Signed   By: Suzen Dials M.D.   On: 04/06/2024 17:55     Procedures   Medications Ordered in the ED  oxyCODONE -acetaminophen  (PERCOCET/ROXICET)  5-325 MG per tablet 1 tablet (has no administration in time range)                                    Medical Decision Making This patient presents to the ED for concern of fall, this involves an extensive number of treatment options, and is a complaint that carries with it a high risk of complications and morbidity.  The differential diagnosis includes fracture, dislocation, musculoskeletal sprain/strain/sprain, fracture with neurovascular compromise.   Co morbidities that complicate the patient evaluation  Hyperlipidemia, hypertension, diabetes, hypothyroidism   Additional history obtained:  Additional history obtained from record review External records from outside source obtained and reviewed including prior ED note    Imaging Studies ordered:  I ordered imaging studies including x-ray left elbow, CT L elbow I independently visualized and interpreted imaging which showed  - XR: Acute supracondylar fracture of the distal left humerus. - CT: Pending at time of shift change  I agree with the radiologist interpretation   Cardiac Monitoring: / EKG:  The patient was maintained on a cardiac monitor.  I personally viewed and interpreted the cardiac monitored which showed an underlying rhythm of: NSR   Consultations Obtained:  I requested consultation with the orthopedist on call,  and discussed lab and imaging findings as well as pertinent plan - they recommend: Spoke with Dr. Sherida who recommends elbow CT then posterior splint and follow-up with Dr. Dozier in the office.   Problem List / ED Course / Critical interventions / Medication management  I ordered medication including Percocet for pain Reevaluation of the patient after these medicines showed that the patient improved I have reviewed the patients home medicines and have made adjustments as needed   Test / Admission - Considered:  Physical exam notable as above, the left upper extremity is neurovascularly  intact.  X-ray imaging as above, CT pending at time of shift change.  See above for orthopedic recommendations.  I have provided the patient with Percocet to be used as needed for breakthrough pain.  Patient understands that she needs to contact Dr. Adrian office tomorrow morning to schedule follow-up.  Patient handed off to oncoming PA-C Wonda Simpers with CT elbow and posterior splint placement pending, please see their note for assessment/plan/dispo.     Amount and/or Complexity of Data Reviewed Radiology: ordered.  Risk Prescription drug management.        Final diagnoses:  Closed supracondylar fracture of left humerus, initial encounter    ED Discharge Orders          Ordered    oxyCODONE -acetaminophen  (PERCOCET/ROXICET) 5-325 MG tablet  Every 6 hours PRN  04/06/24 1853               Terri Rocky SAILOR, PA-C 04/06/24 1901    Pamella Ozell LABOR, DO 04/08/24 [phone]

## 2024-04-06 NOTE — Discharge Instructions (Addendum)
 I have provided you with the contact information for an orthopedist specialist, please call their office tomorrow morning to schedule a follow-up visit.  Continue Tylenol  as needed for pain, I will prescribe you with a short course of Percocet to be used every 6 hours as needed for breakthrough pain, please be cautioned that this medication may cause fatigue/drowsiness.

## 2024-04-06 NOTE — ED Triage Notes (Signed)
 Pt c/o pain to LT elbow after falling on it today

## 2024-04-07 DIAGNOSIS — S42412A Displaced simple supracondylar fracture without intercondylar fracture of left humerus, initial encounter for closed fracture: Secondary | ICD-10-CM | POA: Diagnosis not present

## 2024-04-09 DIAGNOSIS — S42492A Other displaced fracture of lower end of left humerus, initial encounter for closed fracture: Secondary | ICD-10-CM | POA: Diagnosis not present

## 2024-04-16 DIAGNOSIS — S42492D Other displaced fracture of lower end of left humerus, subsequent encounter for fracture with routine healing: Secondary | ICD-10-CM | POA: Diagnosis not present

## 2024-05-14 DIAGNOSIS — S42492D Other displaced fracture of lower end of left humerus, subsequent encounter for fracture with routine healing: Secondary | ICD-10-CM | POA: Diagnosis not present

## 2024-05-22 DIAGNOSIS — R531 Weakness: Secondary | ICD-10-CM | POA: Diagnosis not present

## 2024-05-22 DIAGNOSIS — M25622 Stiffness of left elbow, not elsewhere classified: Secondary | ICD-10-CM | POA: Diagnosis not present

## 2024-05-22 DIAGNOSIS — S42402D Unspecified fracture of lower end of left humerus, subsequent encounter for fracture with routine healing: Secondary | ICD-10-CM | POA: Diagnosis not present

## 2024-05-29 DIAGNOSIS — R531 Weakness: Secondary | ICD-10-CM | POA: Diagnosis not present

## 2024-05-29 DIAGNOSIS — M25622 Stiffness of left elbow, not elsewhere classified: Secondary | ICD-10-CM | POA: Diagnosis not present

## 2024-05-29 DIAGNOSIS — S42402D Unspecified fracture of lower end of left humerus, subsequent encounter for fracture with routine healing: Secondary | ICD-10-CM | POA: Diagnosis not present

## 2024-06-02 DIAGNOSIS — M25622 Stiffness of left elbow, not elsewhere classified: Secondary | ICD-10-CM | POA: Diagnosis not present

## 2024-06-04 DIAGNOSIS — M25622 Stiffness of left elbow, not elsewhere classified: Secondary | ICD-10-CM | POA: Diagnosis not present

## 2024-06-04 DIAGNOSIS — S42402D Unspecified fracture of lower end of left humerus, subsequent encounter for fracture with routine healing: Secondary | ICD-10-CM | POA: Diagnosis not present

## 2024-06-04 DIAGNOSIS — R531 Weakness: Secondary | ICD-10-CM | POA: Diagnosis not present

## 2024-06-09 DIAGNOSIS — R531 Weakness: Secondary | ICD-10-CM | POA: Diagnosis not present

## 2024-06-09 DIAGNOSIS — S42402D Unspecified fracture of lower end of left humerus, subsequent encounter for fracture with routine healing: Secondary | ICD-10-CM | POA: Diagnosis not present

## 2024-06-09 DIAGNOSIS — M25622 Stiffness of left elbow, not elsewhere classified: Secondary | ICD-10-CM | POA: Diagnosis not present

## 2024-06-11 DIAGNOSIS — S42402D Unspecified fracture of lower end of left humerus, subsequent encounter for fracture with routine healing: Secondary | ICD-10-CM | POA: Diagnosis not present

## 2024-06-11 DIAGNOSIS — M25622 Stiffness of left elbow, not elsewhere classified: Secondary | ICD-10-CM | POA: Diagnosis not present

## 2024-06-11 DIAGNOSIS — R531 Weakness: Secondary | ICD-10-CM | POA: Diagnosis not present

## 2024-06-18 DIAGNOSIS — S42402D Unspecified fracture of lower end of left humerus, subsequent encounter for fracture with routine healing: Secondary | ICD-10-CM | POA: Diagnosis not present

## 2024-06-18 DIAGNOSIS — R531 Weakness: Secondary | ICD-10-CM | POA: Diagnosis not present

## 2024-06-18 DIAGNOSIS — M25622 Stiffness of left elbow, not elsewhere classified: Secondary | ICD-10-CM | POA: Diagnosis not present

## 2024-06-23 DIAGNOSIS — D225 Melanocytic nevi of trunk: Secondary | ICD-10-CM | POA: Diagnosis not present

## 2024-06-23 DIAGNOSIS — Z85828 Personal history of other malignant neoplasm of skin: Secondary | ICD-10-CM | POA: Diagnosis not present

## 2024-06-23 DIAGNOSIS — L814 Other melanin hyperpigmentation: Secondary | ICD-10-CM | POA: Diagnosis not present

## 2024-06-23 DIAGNOSIS — Z08 Encounter for follow-up examination after completed treatment for malignant neoplasm: Secondary | ICD-10-CM | POA: Diagnosis not present

## 2024-06-23 DIAGNOSIS — L821 Other seborrheic keratosis: Secondary | ICD-10-CM | POA: Diagnosis not present

## 2024-06-25 DIAGNOSIS — M25622 Stiffness of left elbow, not elsewhere classified: Secondary | ICD-10-CM | POA: Diagnosis not present

## 2024-06-25 DIAGNOSIS — S42402D Unspecified fracture of lower end of left humerus, subsequent encounter for fracture with routine healing: Secondary | ICD-10-CM | POA: Diagnosis not present

## 2024-06-25 DIAGNOSIS — R531 Weakness: Secondary | ICD-10-CM | POA: Diagnosis not present

## 2024-07-02 DIAGNOSIS — R531 Weakness: Secondary | ICD-10-CM | POA: Diagnosis not present

## 2024-07-02 DIAGNOSIS — S42402D Unspecified fracture of lower end of left humerus, subsequent encounter for fracture with routine healing: Secondary | ICD-10-CM | POA: Diagnosis not present

## 2024-07-02 DIAGNOSIS — M25622 Stiffness of left elbow, not elsewhere classified: Secondary | ICD-10-CM | POA: Diagnosis not present
